# Patient Record
Sex: Male | Born: 1975 | Race: White | Hispanic: No | Marital: Married | Smoking: Former smoker
Health system: Southern US, Community
[De-identification: ages and names within clinical notes are randomized; demographics above are authoritative.]

## PROBLEM LIST (undated history)

## (undated) DIAGNOSIS — T7840XA Allergy, unspecified, initial encounter: Secondary | ICD-10-CM

## (undated) DIAGNOSIS — T8859XA Other complications of anesthesia, initial encounter: Secondary | ICD-10-CM

## (undated) HISTORY — PX: SPINE SURGERY: SHX786

## (undated) HISTORY — DX: Allergy, unspecified, initial encounter: T78.40XA

## (undated) HISTORY — PX: WISDOM TOOTH EXTRACTION: SHX21

---

## 1997-12-06 ENCOUNTER — Emergency Department (HOSPITAL_COMMUNITY): Admission: EM | Admit: 1997-12-06 | Discharge: 1997-12-06 | Payer: Self-pay | Admitting: Emergency Medicine

## 1998-12-14 ENCOUNTER — Emergency Department (HOSPITAL_COMMUNITY): Admission: EM | Admit: 1998-12-14 | Discharge: 1998-12-14 | Payer: Self-pay | Admitting: Emergency Medicine

## 1998-12-14 ENCOUNTER — Encounter: Payer: Self-pay | Admitting: Emergency Medicine

## 2001-12-16 ENCOUNTER — Emergency Department (HOSPITAL_COMMUNITY): Admission: EM | Admit: 2001-12-16 | Discharge: 2001-12-17 | Payer: Self-pay | Admitting: Emergency Medicine

## 2008-07-25 ENCOUNTER — Emergency Department (HOSPITAL_COMMUNITY): Admission: EM | Admit: 2008-07-25 | Discharge: 2008-07-25 | Payer: Self-pay | Admitting: Emergency Medicine

## 2012-09-29 ENCOUNTER — Ambulatory Visit (INDEPENDENT_AMBULATORY_CARE_PROVIDER_SITE_OTHER): Payer: BC Managed Care – PPO | Admitting: Internal Medicine

## 2012-09-29 ENCOUNTER — Ambulatory Visit: Payer: BC Managed Care – PPO

## 2012-09-29 VITALS — BP 126/80 | HR 69 | Temp 97.8°F | Resp 18 | Ht 75.0 in | Wt 218.4 lb

## 2012-09-29 DIAGNOSIS — M25571 Pain in right ankle and joints of right foot: Secondary | ICD-10-CM

## 2012-09-29 DIAGNOSIS — S92401A Displaced unspecified fracture of right great toe, initial encounter for closed fracture: Secondary | ICD-10-CM

## 2012-09-29 DIAGNOSIS — S92919A Unspecified fracture of unspecified toe(s), initial encounter for closed fracture: Secondary | ICD-10-CM

## 2012-09-29 DIAGNOSIS — M25579 Pain in unspecified ankle and joints of unspecified foot: Secondary | ICD-10-CM

## 2012-09-29 MED ORDER — HYDROCODONE-ACETAMINOPHEN 5-325 MG PO TABS: 1.0000 | ORAL_TABLET | Freq: Four times a day (QID) | ORAL | Status: DC | PRN

## 2012-09-29 NOTE — Progress Notes (Signed)
  Subjective:    Patient ID: Frank Collier, male    DOB: 1975/10/12, 37 y.o.   MRN: [id number]  HPI 37 year old male presents with right great toe pain after an injury today.  He was cleaning out his trash bin today and the lid dropped and hit his right great toe.  Injury occurred today approximately 2 hours ago and since has been increasing in pain rapidly.  Also admits to the development of ecchymosis at the proximal nail fold and so he promptly came in for evaluation.  Has limited ROM secondary to pain.  No paresthesias or weakness.       Review of Systems  Gastrointestinal: Negative for nausea and vomiting.  Musculoskeletal: Positive for joint swelling.  Skin: Positive for color change. Negative for wound.  Neurological: Negative for dizziness.       Objective:   Physical Exam  Constitutional: He is oriented to person, place, and time. He appears well-developed and well-nourished.  HENT:  Head: Normocephalic and atraumatic.  Right Ear: External ear normal.  Left Ear: External ear normal.  Eyes: Conjunctivae are normal.  Neck: Normal range of motion.  Cardiovascular: Normal rate.   Pulmonary/Chest: Effort normal.  Musculoskeletal:       Left ankle: Normal.  Right great toe has pain to palpation at interphalangeal joint with ecchymosis at proximal nail fold.  Capillary refill <2 seconds. Sensation intact.    Neurological: He is alert and oriented to person, place, and time.  Psychiatric: He has a normal mood and affect. His behavior is normal. Judgment and thought content normal.         [facility] reading (PRIMARY) by  Dr. Merla Riches as nondisplaced fracture of distal phalanx at medial border of joint line.   Assessment & Plan:  Fracture of great toe, right, closed, initial encounter  Pain in joint, ankle and foot, right - Plan: DG Foot Complete Right, HYDROcodone-acetaminophen (NORCO) 5-325 MG per tablet  Patient Instructions  Recommend ice for 20 minutes three times daily  over the next 72 hours Elevate foot as much as possible Norco 5/325 mg q6hours prn pain Wear post-op shoe while ambulating. Ok to take it off at [employer] of work until follow up.   Follow up on 10/03/12 for recheck   I have reviewed and agree with documentation. Robert P. Merla Riches, M.D.

## 2012-09-29 NOTE — Patient Instructions (Addendum)
Recommend ice for 20 minutes three times daily over the next 72 hours Elevate foot as much as possible Wear post-op shoe while ambulating. Ok to take it off at night Follow up on 10/03/12 for recheck

## 2012-10-03 ENCOUNTER — Ambulatory Visit (INDEPENDENT_AMBULATORY_CARE_PROVIDER_SITE_OTHER): Payer: BC Managed Care – PPO | Admitting: Family Medicine

## 2012-10-03 ENCOUNTER — Ambulatory Visit: Payer: BC Managed Care – PPO

## 2012-10-03 VITALS — BP 129/78 | HR 86 | Temp 98.0°F | Resp 16 | Ht 75.0 in | Wt 218.0 lb

## 2012-10-03 DIAGNOSIS — S99921D Unspecified injury of right foot, subsequent encounter: Secondary | ICD-10-CM

## 2012-10-03 DIAGNOSIS — Z5189 Encounter for other specified aftercare: Secondary | ICD-10-CM

## 2012-10-03 DIAGNOSIS — S92401D Displaced unspecified fracture of right great toe, subsequent encounter for fracture with routine healing: Secondary | ICD-10-CM

## 2012-10-03 NOTE — Progress Notes (Signed)
 [facility]:  Office Visit  Chief Complaint:  Chief Complaint  Patient presents with  . Follow-up    right big toe broken    HPI: Frank Collier is a 37 y.o. male who complains of  Here for recheck of right great toe. Had a lot of bruising and then decreased, with secrease swelling. Still has not put full weight  on it but feels better. Drives a Multimedia programmer. Foot xray was negative for fx/dislocation 4 days ago.   History reviewed. No pertinent past medical history. History reviewed. No pertinent past surgical history. History   Social History  . Marital Status: Married    Spouse Name: N/A    Number of Children: N/A  . Years of Education: N/A   Social History Main Topics  . Smoking status: Never Smoker   . Smokeless tobacco: None  . Alcohol Use: Yes  . Drug Use: No  . Sexually Active: None   Other Topics Concern  . None   Social History Narrative  . None   Family History  Problem Relation Age of Onset  . Heart disease Maternal Grandfather    No Known Allergies Prior to Admission medications   Medication Sig Start Date End Date Taking? Authorizing Provider  HYDROcodone-acetaminophen (NORCO) 5-325 MG per tablet Take 1 tablet by mouth every 6 (six) hours as needed for pain. 09/29/12   Heather Jaquita Rector, PA-C     ROS: The patient denies fevers, chills, night sweats, unintentional weight loss, chest pain, palpitations, wheezing, dyspnea on exertion, nausea, vomiting, abdominal pain, dysuria, hematuria, melena, numbness, weakness, or tingling.   All other systems have been reviewed and were otherwise negative with the exception of those mentioned in the HPI and as above.    PHYSICAL EXAM: Filed Vitals:   10/03/12 0850  BP: 129/78  Pulse: 86  Temp: 98 F (36.7 C)  Resp: 16   Filed Vitals:   10/03/12 0850  Height: 6\' 3"  (1.905 m)  Weight: 218 lb (98.884 kg)   Body mass index is 27.25 kg/(m^2).  General: Alert, no acute distress HEENT:   Normocephalic, atraumatic, oropharynx patent.  Cardiovascular:  Regular rate and rhythm, no rubs murmurs or gallops.  No Carotid bruits, radial pulse intact. No pedal edema.  Respiratory: Clear to auscultation bilaterally.  No wheezes, rales, or rhonchi.  No cyanosis, no use of accessory musculature GI: No organomegaly, abdomen is soft and non-tender, positive bowel sounds.  No masses. Skin: No rashes. Neurologic: Facial musculature symmetric. Psychiatric: Patient is appropriate throughout our interaction. Lymphatic: No cervical lymphadenopathy Musculoskeletal: Gait intact. Right great toe + right toe bruising, + tender on medial Distal portion + DP + swelling Sensation intact, ROm limited but improved due to pain    LABS: No results found for this or any previous visit.   EKG/XRAY:   Primary read interpreted by Dr. Conley Rolls at [facility]. Nondisplaced Fracture of medial distal phalanx of great toe   ASSESSMENT/PLAN: Encounter Diagnoses  Name Primary?  . Injury of great toe, right, subsequent encounter Yes  . Fracture of great toe, right, closed, with routine healing, subsequent encounter    Got a closer look at great toe with digit xray There is a nondisplaced fracture Covered under global period He will return to work with precautions on Monday I think it should be ok, if he has pain and cannot use his foot then he will let his supervisor know Malunion precautions and activity modifications given Buddy tape great toe  and use steel toe boot, at home use post op shoe.  C/w Hydrocodone and NSAIDs prn F/u prn otherwise in 2 weeks for f/u     ,  PHUONG, DO 10/05/2012 1:59 PM

## 2012-10-06 ENCOUNTER — Telehealth: Payer: Self-pay

## 2012-10-06 NOTE — Telephone Encounter (Signed)
This is reasonable, I have called to advise.

## 2012-10-06 NOTE — Telephone Encounter (Signed)
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

## 2012-10-20 ENCOUNTER — Ambulatory Visit: Payer: BC Managed Care – PPO

## 2012-10-20 ENCOUNTER — Ambulatory Visit (INDEPENDENT_AMBULATORY_CARE_PROVIDER_SITE_OTHER): Payer: BC Managed Care – PPO | Admitting: Family Medicine

## 2012-10-20 VITALS — BP 118/76 | HR 88 | Temp 97.7°F | Resp 16 | Ht 75.0 in | Wt 224.0 lb

## 2012-10-20 DIAGNOSIS — S8290XS Unspecified fracture of unspecified lower leg, sequela: Secondary | ICD-10-CM

## 2012-10-20 DIAGNOSIS — M79609 Pain in unspecified limb: Secondary | ICD-10-CM

## 2012-10-20 DIAGNOSIS — S92401S Displaced unspecified fracture of right great toe, sequela: Secondary | ICD-10-CM

## 2012-10-20 DIAGNOSIS — M25571 Pain in right ankle and joints of right foot: Secondary | ICD-10-CM

## 2012-10-20 MED ORDER — HYDROCODONE-ACETAMINOPHEN 5-325 MG PO TABS: 1.0000 | ORAL_TABLET | Freq: Four times a day (QID) | ORAL | Status: DC | PRN

## 2012-10-20 NOTE — Progress Notes (Signed)
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 States that the pain is much less but that he still does has a significant amount of discomfort. Yesterday we probably would the toe at the end of the boot and he had pain all day. He's noticing pain in his back and other parts of his body as he adjusts to it the weight off the great toe.  Objective: No acute distress Right great toe shows mild swelling and a small subungual hematoma at the lunula. [facility] reading (PRIMARY) by  Dr. Milus Glazier:  Right great toe. Still some gap in the diagonal lateral proximal piece of bone in the distal phalanx of the great toe.  Assessment: Without callus, patient still has a stable fracture and needs more time.  Plan: Return in 10 days for followup. unable to work until then  [employer], [employer].D.

## 2012-10-30 ENCOUNTER — Ambulatory Visit (INDEPENDENT_AMBULATORY_CARE_PROVIDER_SITE_OTHER): Payer: BC Managed Care – PPO | Admitting: Emergency Medicine

## 2012-10-30 VITALS — BP 120/78 | HR 87 | Temp 97.7°F | Resp 16 | Ht 76.5 in | Wt 219.0 lb

## 2012-10-30 DIAGNOSIS — S8290XS Unspecified fracture of unspecified lower leg, sequela: Secondary | ICD-10-CM

## 2012-10-30 DIAGNOSIS — S92401S Displaced unspecified fracture of right great toe, sequela: Secondary | ICD-10-CM

## 2012-10-30 MED ORDER — NAPROXEN SODIUM 550 MG PO TABS: 550.0000 mg | ORAL_TABLET | Freq: Two times a day (BID) | ORAL | Status: AC

## 2012-10-30 NOTE — Progress Notes (Signed)
[facility] [address] [place] [address] [phone]  Date:  10/30/2012   Name:  Willian Collier   DOB:  [birth year]   MRN:  [id number]  PCP:  Nilda Simmer, MD    Chief Complaint: Follow-up   History of Present Illness:  Frank Collier is a 37 y.o. very pleasant male patient who presents with the following:  Four weeks out from fracture RIGHT great toe.  Has persistent pain.  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 Yesterday put on his work boot and had pain within 10 minutes.  Not taking any medication currently.  Denies other complaint or health concern today.   There are no active problems to display for this patient.   Past Medical History  Diagnosis Date  . Allergy     seasonal    Past Surgical History  Procedure Laterality Date  . Spine surgery      epidural  . Wisdom tooth extraction      History  Substance Use Topics  . Smoking status: Never Smoker   . Smokeless tobacco: Not on file  . Alcohol Use: Yes    Family History  Problem Relation Age of Onset  . Heart disease Maternal Grandfather     No Known Allergies  Medication list has been reviewed and updated.  Current Outpatient Prescriptions on File Prior to Visit  Medication Sig Dispense Refill  . HYDROcodone-acetaminophen (NORCO) 5-325 MG per tablet Take 1 tablet by mouth every 6 (six) hours as needed for pain.  15 tablet  0   No current facility-administered medications on file prior to visit.    Review of Systems:  As per HPI, otherwise negative.    Physical Examination: Filed Vitals:   10/30/12 0814  BP: 120/78  Pulse: 87  Temp: 97.7 F (36.5 C)  Resp: 16   Filed Vitals:   10/30/12 0814  Height: 6' 4.5" (1.943 m)  Weight: 219 lb (99.338 kg)   Body mass index is 26.31 kg/(m^2). Ideal Body Weight: Weight in (lb) to have BMI = 25: 207.7   GEN: WDWN, NAD, Non-toxic, Alert & Oriented x 3 HEENT: Atraumatic, Normocephalic.  Ears and Nose: No  external deformity. EXTR: No clubbing/cyanosis/edema NEURO: Normal gait.  PSYCH: Normally interactive. Conversant. Not depressed or anxious appearing.  Calm demeanor.  RIGHT foot:  Tender great toe.  Guards.  No deformity or ecchymosis  Assessment and Plan: Fracture right great toe. Anaprox Follow up in 2 weeks   Signed,  Phillips Odor, MD

## 2012-10-30 NOTE — Patient Instructions (Addendum)
Toe Fracture  Your caregiver has diagnosed you as having a fractured toe. A toe fracture is a break in the bone of a toe. "Buddy taping" is a way of splinting your broken toe, by taping the broken toe to the toe next to it. This "buddy taping" will keep the injured toe from moving beyond normal range of motion. Buddy taping also helps the toe heal in a more normal alignment. It may take 6 to 8 weeks for the toe injury to heal.  HOME CARE INSTRUCTIONS    Leave your toes taped together for as long as directed by your caregiver or until you see a doctor for a follow-up examination. You can change the tape after bathing. Always use a small piece of gauze or cotton between the toes when taping them together. This will help the skin stay dry and prevent infection.   Apply ice to the injury for 15-20 minutes each hour while awake for the first 2 days. Put the ice in a plastic bag and place a towel between the bag of ice and your skin.   After the first 2 days, apply heat to the injured area. Use heat for the next 2 to 3 days. Place a heating pad on the foot or soak the foot in warm water as directed by your caregiver.   Keep your foot elevated as much as possible to lessen swelling.   Wear sturdy, supportive shoes. The shoes should not pinch the toes or fit tightly against the toes.   Your caregiver may prescribe a rigid shoe if your foot is very swollen.   Your may be given crutches if the pain is too great and it hurts too much to walk.   Only take over-the-counter or prescription medicines for pain, discomfort, or fever as directed by your caregiver.   If your caregiver has given you a follow-up appointment, it is very important to keep that appointment. Not keeping the appointment could result in a chronic or permanent injury, pain, and disability. If there is any problem keeping the appointment, you must call back to this facility for assistance.  SEEK MEDICAL CARE IF:    You have increased pain or swelling,  not relieved with medications.   The pain does not get better after 1 week.   Your injured toe is cold when the others are warm.  SEEK IMMEDIATE MEDICAL CARE IF:    The toe becomes cold, numb, or white.   The toe becomes hot (inflamed) and red.  Document Released: 03/30/2000 Document Revised: 06/25/2011 Document Reviewed: 11/17/2007  ExitCare Patient Information 2014 [employer].

## 2012-11-13 ENCOUNTER — Ambulatory Visit (INDEPENDENT_AMBULATORY_CARE_PROVIDER_SITE_OTHER): Payer: BC Managed Care – PPO | Admitting: Emergency Medicine

## 2012-11-13 ENCOUNTER — Ambulatory Visit: Payer: BC Managed Care – PPO

## 2012-11-13 VITALS — BP 120/78 | HR 83 | Temp 97.9°F | Resp 16 | Ht 75.0 in | Wt 221.0 lb

## 2012-11-13 DIAGNOSIS — S92911D Unspecified fracture of right toe(s), subsequent encounter for fracture with routine healing: Secondary | ICD-10-CM

## 2012-11-13 DIAGNOSIS — S8290XD Unspecified fracture of unspecified lower leg, subsequent encounter for closed fracture with routine healing: Secondary | ICD-10-CM

## 2012-11-13 NOTE — Progress Notes (Signed)
  Subjective:    Patient ID: Frank Collier, male    DOB: Jul 17, 1975, 37 y.o.   MRN: [id number]  HPI patient here to follow up right great toe fracture. He is doing well. He does have persistent pain. 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    Review of Systems     Objective:   Physical Exam There is no significant swelling of the great toe. He has limited flexion extension at the IP and MTP joint  [facility] reading (PRIMARY) by  Dr. Cleta Alberts healing fracture       Assessment & Plan:  The patient is doing well. He is to use her stretching exercises for his great toe. He was released to return to work on 11/22/2012 no restrictions. 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

## 2012-11-25 ENCOUNTER — Encounter: Payer: Self-pay | Admitting: Family Medicine

## 2013-03-04 ENCOUNTER — Ambulatory Visit (INDEPENDENT_AMBULATORY_CARE_PROVIDER_SITE_OTHER): Payer: BC Managed Care – PPO | Admitting: Family Medicine

## 2013-03-04 ENCOUNTER — Encounter: Payer: Self-pay | Admitting: Family Medicine

## 2013-03-04 DIAGNOSIS — Z9889 Other specified postprocedural states: Secondary | ICD-10-CM | POA: Insufficient documentation

## 2013-03-04 DIAGNOSIS — T6591XS Toxic effect of unspecified substance, accidental (unintentional), sequela: Secondary | ICD-10-CM

## 2013-03-04 DIAGNOSIS — J309 Allergic rhinitis, unspecified: Secondary | ICD-10-CM | POA: Insufficient documentation

## 2013-03-04 LAB — COMPREHENSIVE METABOLIC PANEL
Albumin: 4.5 g/dL (ref 3.5–5.2)
Alkaline Phosphatase: 63 U/L (ref 39–117)
BUN: 12 mg/dL (ref 6–23)
CO2: 24 mEq/L (ref 19–32)
Calcium: 9.6 mg/dL (ref 8.4–10.5)
Chloride: 102 mEq/L (ref 96–112)
Glucose, Bld: 139 mg/dL — ABNORMAL HIGH (ref 70–99)
Potassium: 3.9 mEq/L (ref 3.5–5.3)

## 2013-03-04 LAB — CBC WITH DIFFERENTIAL/PLATELET
Basophils Relative: 0 % (ref 0–1)
HCT: 40.6 % (ref 39.0–52.0)
Hemoglobin: 14 g/dL (ref 13.0–17.0)
Lymphs Abs: 1.9 10*3/uL (ref 0.7–4.0)
MCHC: 34.5 g/dL (ref 30.0–36.0)
Monocytes Absolute: 0.5 10*3/uL (ref 0.1–1.0)
Monocytes Relative: 8 % (ref 3–12)
Neutro Abs: 3 10*3/uL (ref 1.7–7.7)

## 2013-03-04 NOTE — Patient Instructions (Signed)
You received appropriate and adequate care 2 months ago after the stingray injury. The tenderness and nodule may slowly resolve over time. You will be notified about lab results within next 5 days.

## 2013-03-04 NOTE — Progress Notes (Signed)
S:  This 37 y. o. Cauc male is in good health; he is here today for recheck of area on L hand where he was stung by stingray 2 months ago. He picked up a small stingray and was stung; he received medical attention at a medical facility on [facility] on the [address], [place]. The physician prescribed Cipro and pt received Tetanus. He still has some tenderness and notes a small nodule at sting site. His wife is concerned about cardiac sueqelae based on information she has read online. Pt denies fever/chills, increased redness or streaks up arm, fatigue, myalgias or persistent pain or drainage from wound site. He has not experienced CP or tightness, palpitations, SOB or cough or any unusual cardiac or respiratory symptoms.  PMHx, Surg Hx, Soc and Fam Hx reviewed.  ROS: As per HPI.  O: Filed Vitals:   03/04/13 0957  BP: 122/68  Pulse: 79  Temp: 98 F (36.7 C)  Resp: 16   GEN: In NAD: WN,WD. HENT: [place]/AT; EOMI w/ clear conj/sclerae; otherwise unremarkable. COR: RRR. LUNGS: Unlabored resp. SKIN: W&D; intact. L wrist proximal to thenar eminence- 2-3 mm erythematous area with mobile subcutaneous nodule palpable w/ deep palpation. No discharge or fluctuance. Red streaks or joint swelling. MS: Normal ROM in wrist joints. Neurovascular intact. NEURO: A&O x 3; Cns intact. Grip normal in both hands. Nonfocal.  A/P: Marine animal sting, sequela - Plan: CBC with Differential, Comprehensive metabolic panel Reassurance that he received adequate and appropriate treatment at the time of his injury.

## 2013-03-06 ENCOUNTER — Encounter: Payer: Self-pay | Admitting: Family Medicine

## 2013-11-08 IMAGING — CR DG FOOT COMPLETE 3+V*R*
3 series · 3 of 3 positions shown · non-contrast
Comparison: None.

CLINICAL DATA: Dropped trash can lid foot, now with foot pain

RIGHT FOOT COMPLETE - 3+ VIEW

[AP]
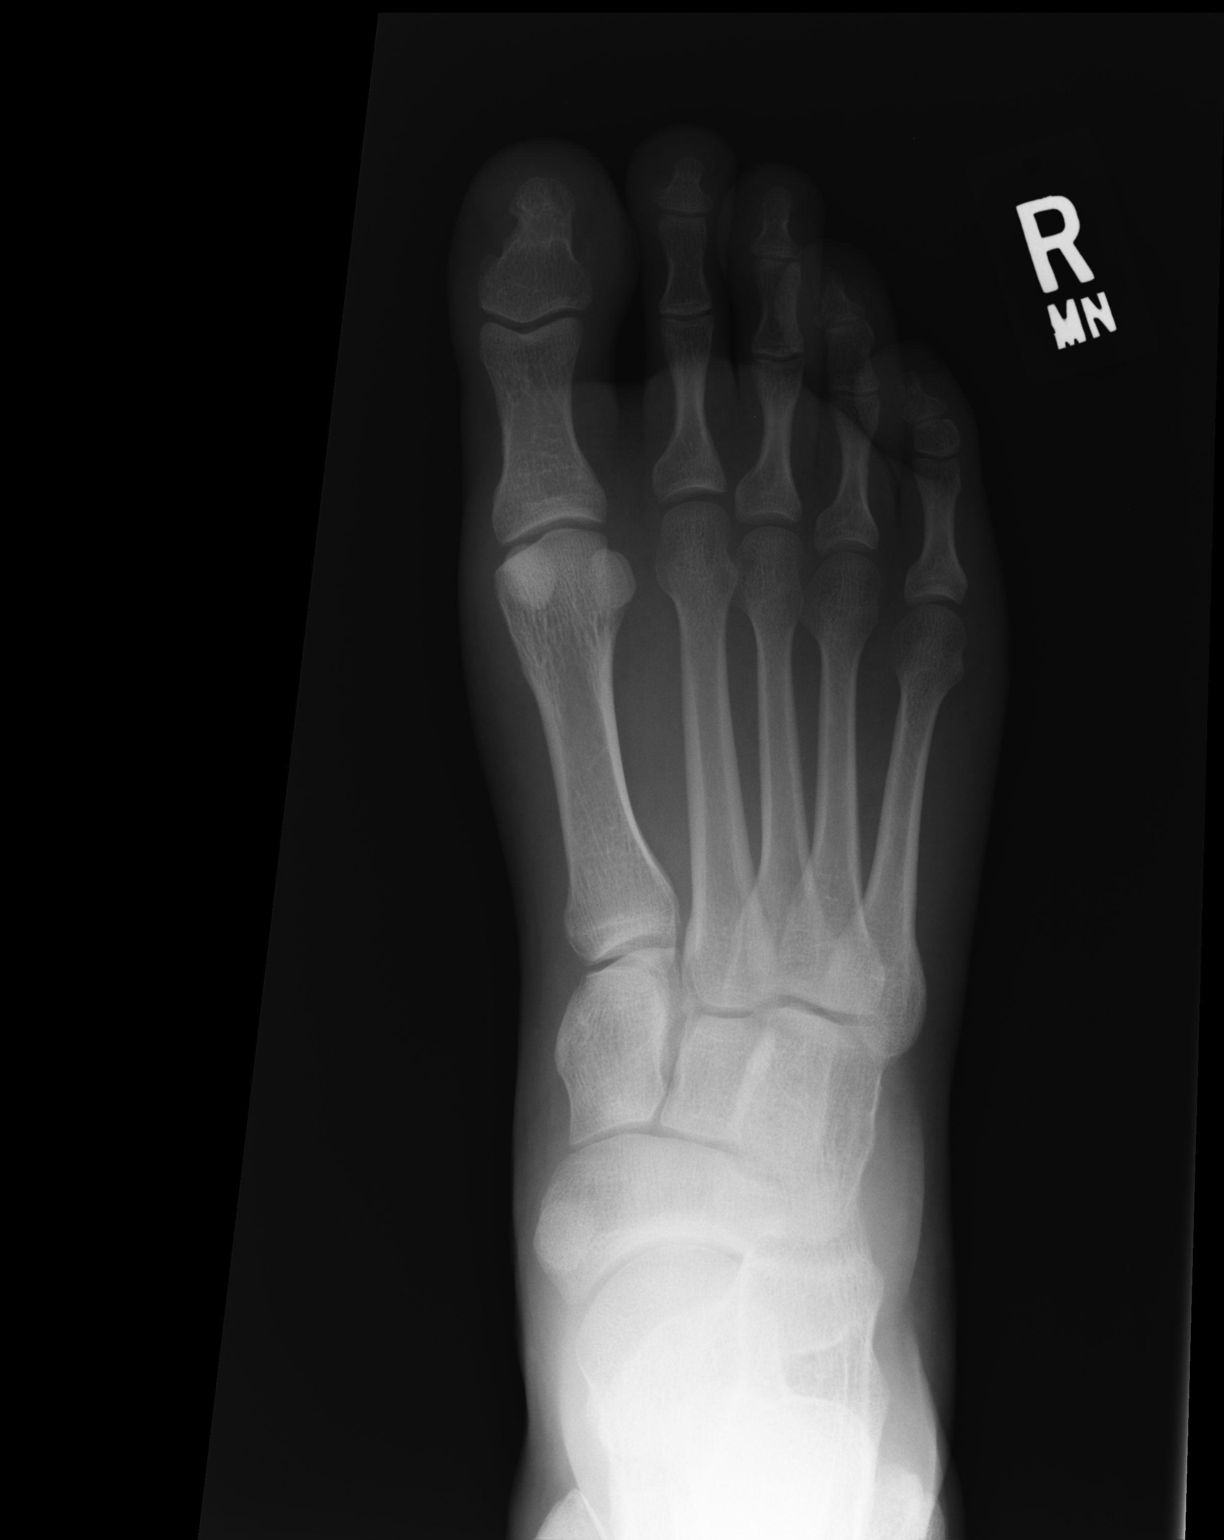

[ap obl int rot]
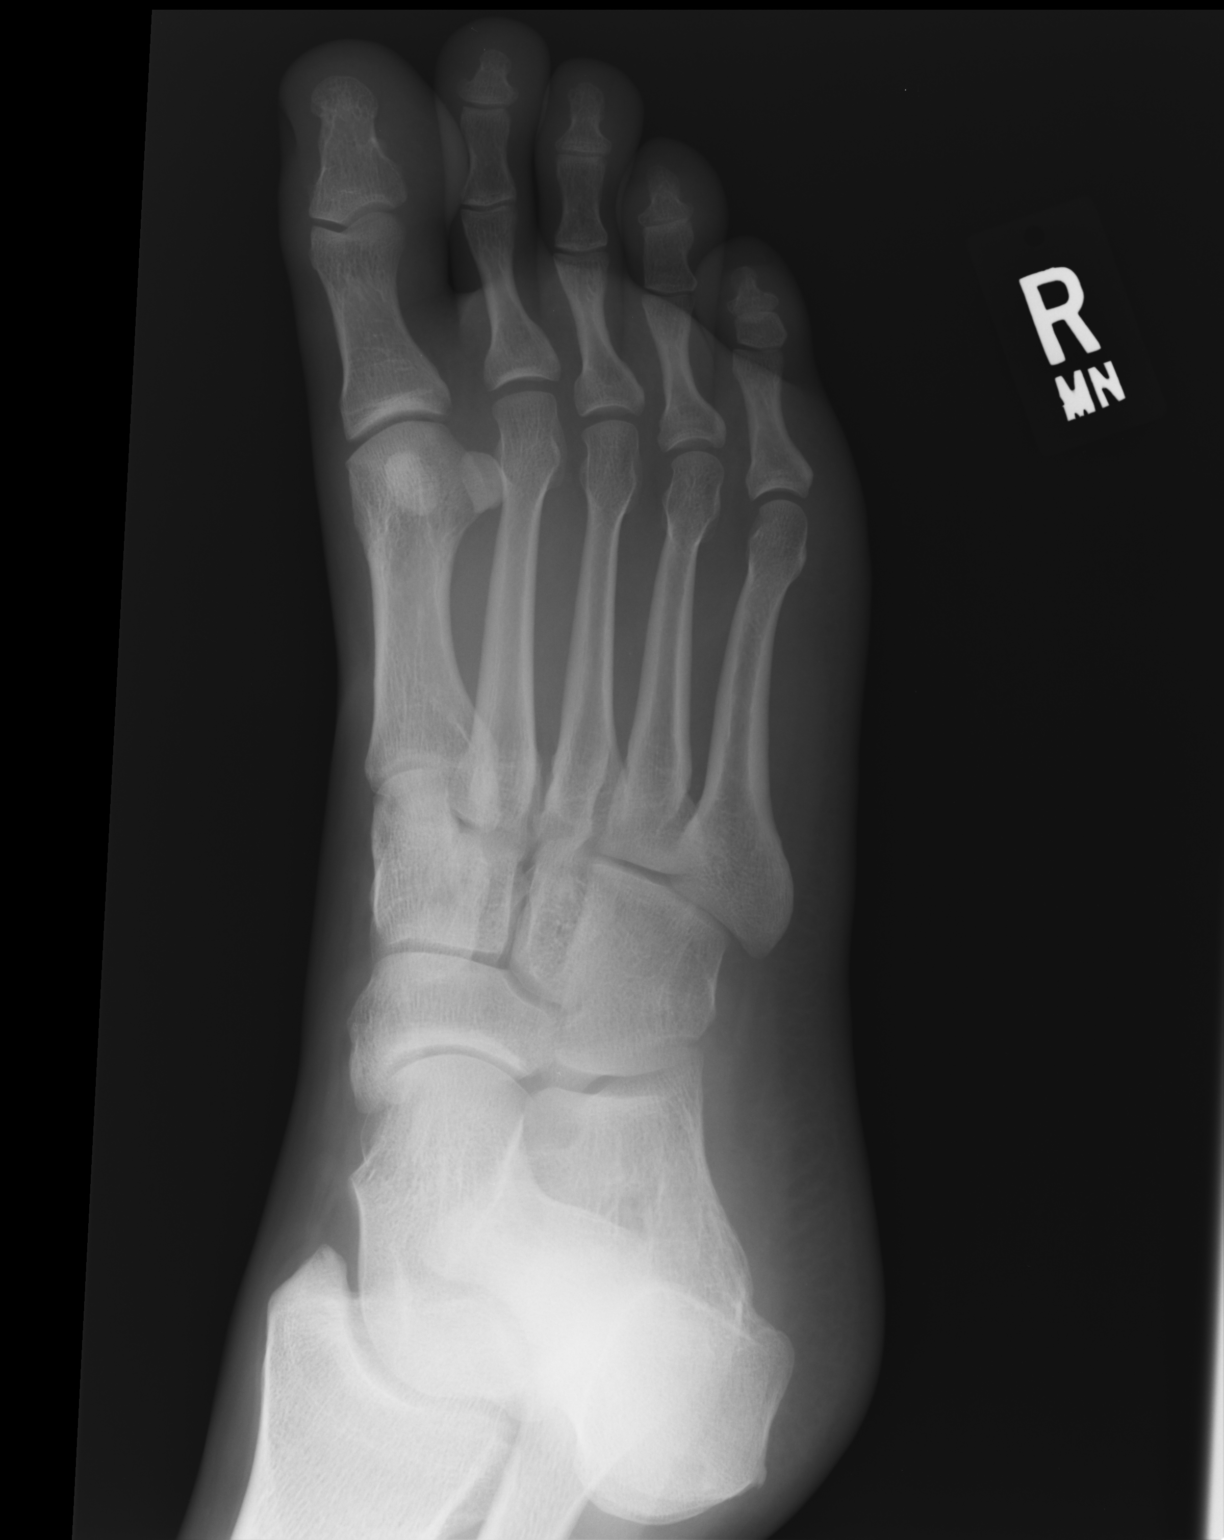

[lateral]
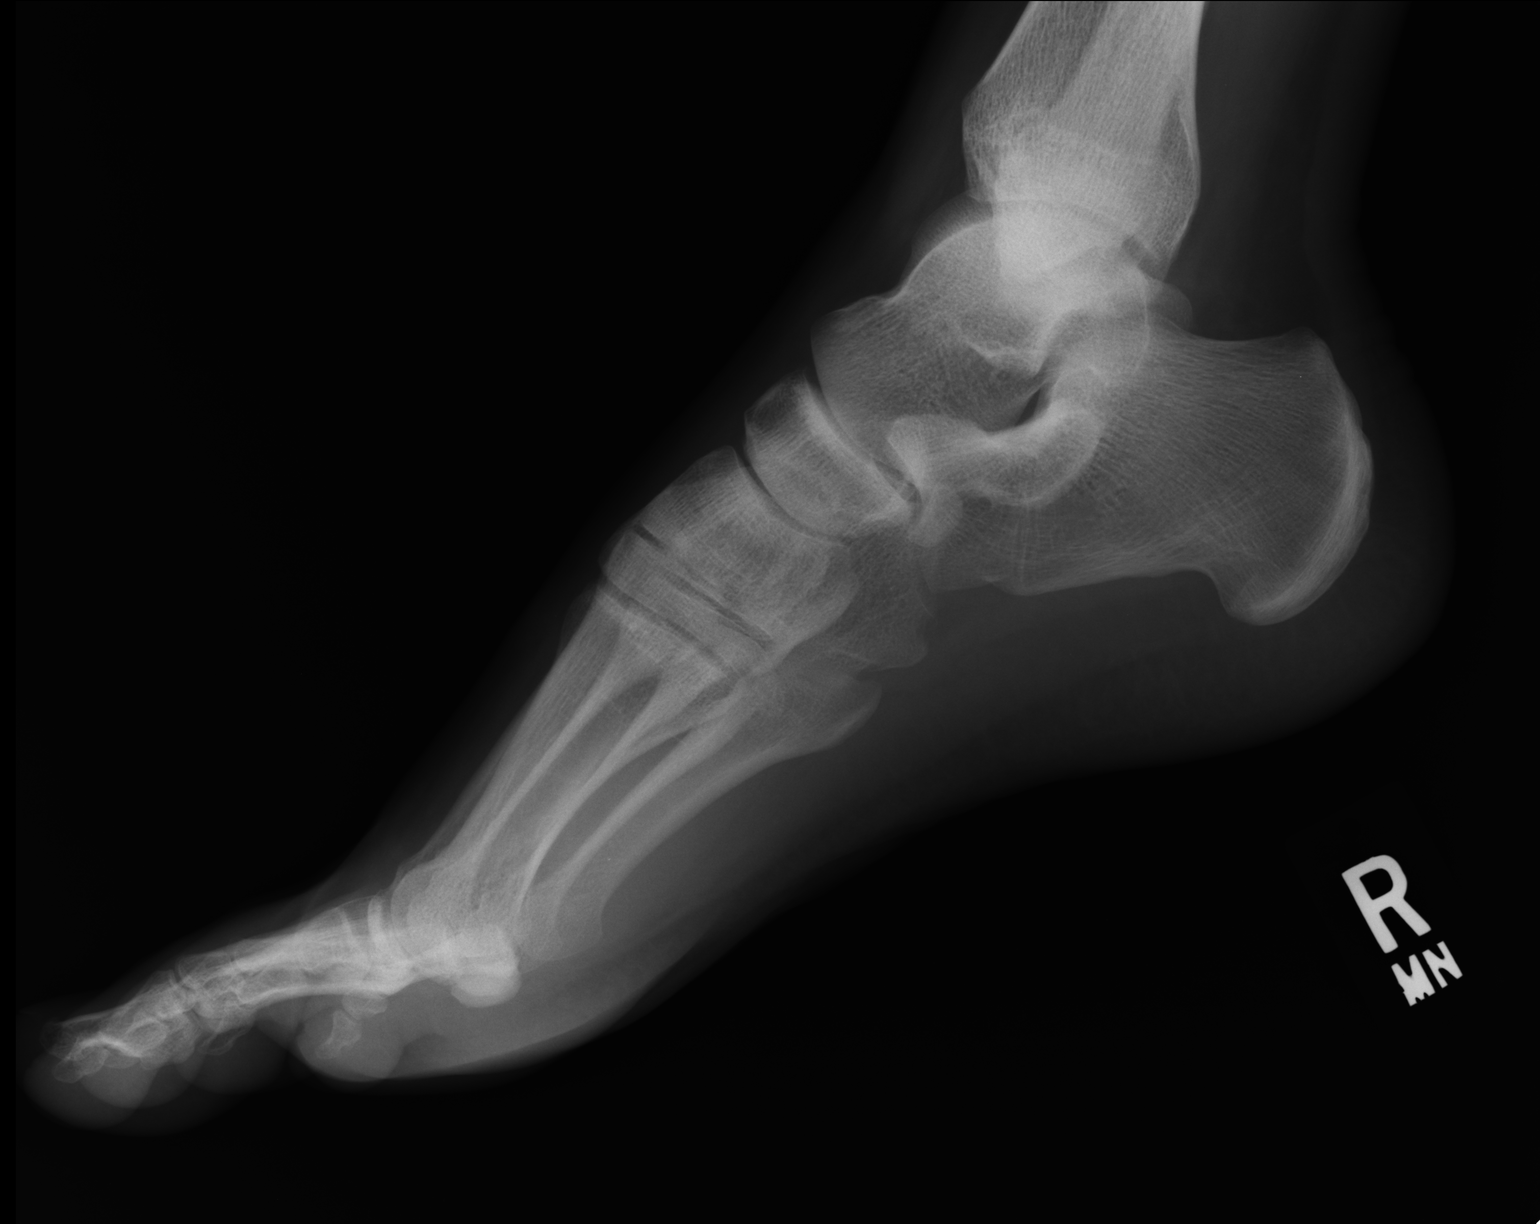

[3 of 3 positions shown; findings below may reference images not displayed]

FINDINGS: No fracture or dislocation.  Joint spaces are preserved.
No erosions.  Regional soft tissues are normal.  No radiopaque
foreign body.
IMPRESSION: No fracture or radiopaque foreign body.  Dedicated radiographs of
the digit of interest may be performed as clinically indicated.

Clinically significant discrepancy from primary report, if
provided: None

## 2013-12-23 IMAGING — CR DG TOE GREAT 2+V*R*
2 series · 2 of 2 positions shown · non-contrast
Comparison: 10/20/2012

CLINICAL DATA: Recheck fracture.

RIGHT GREAT TOE

[AP]
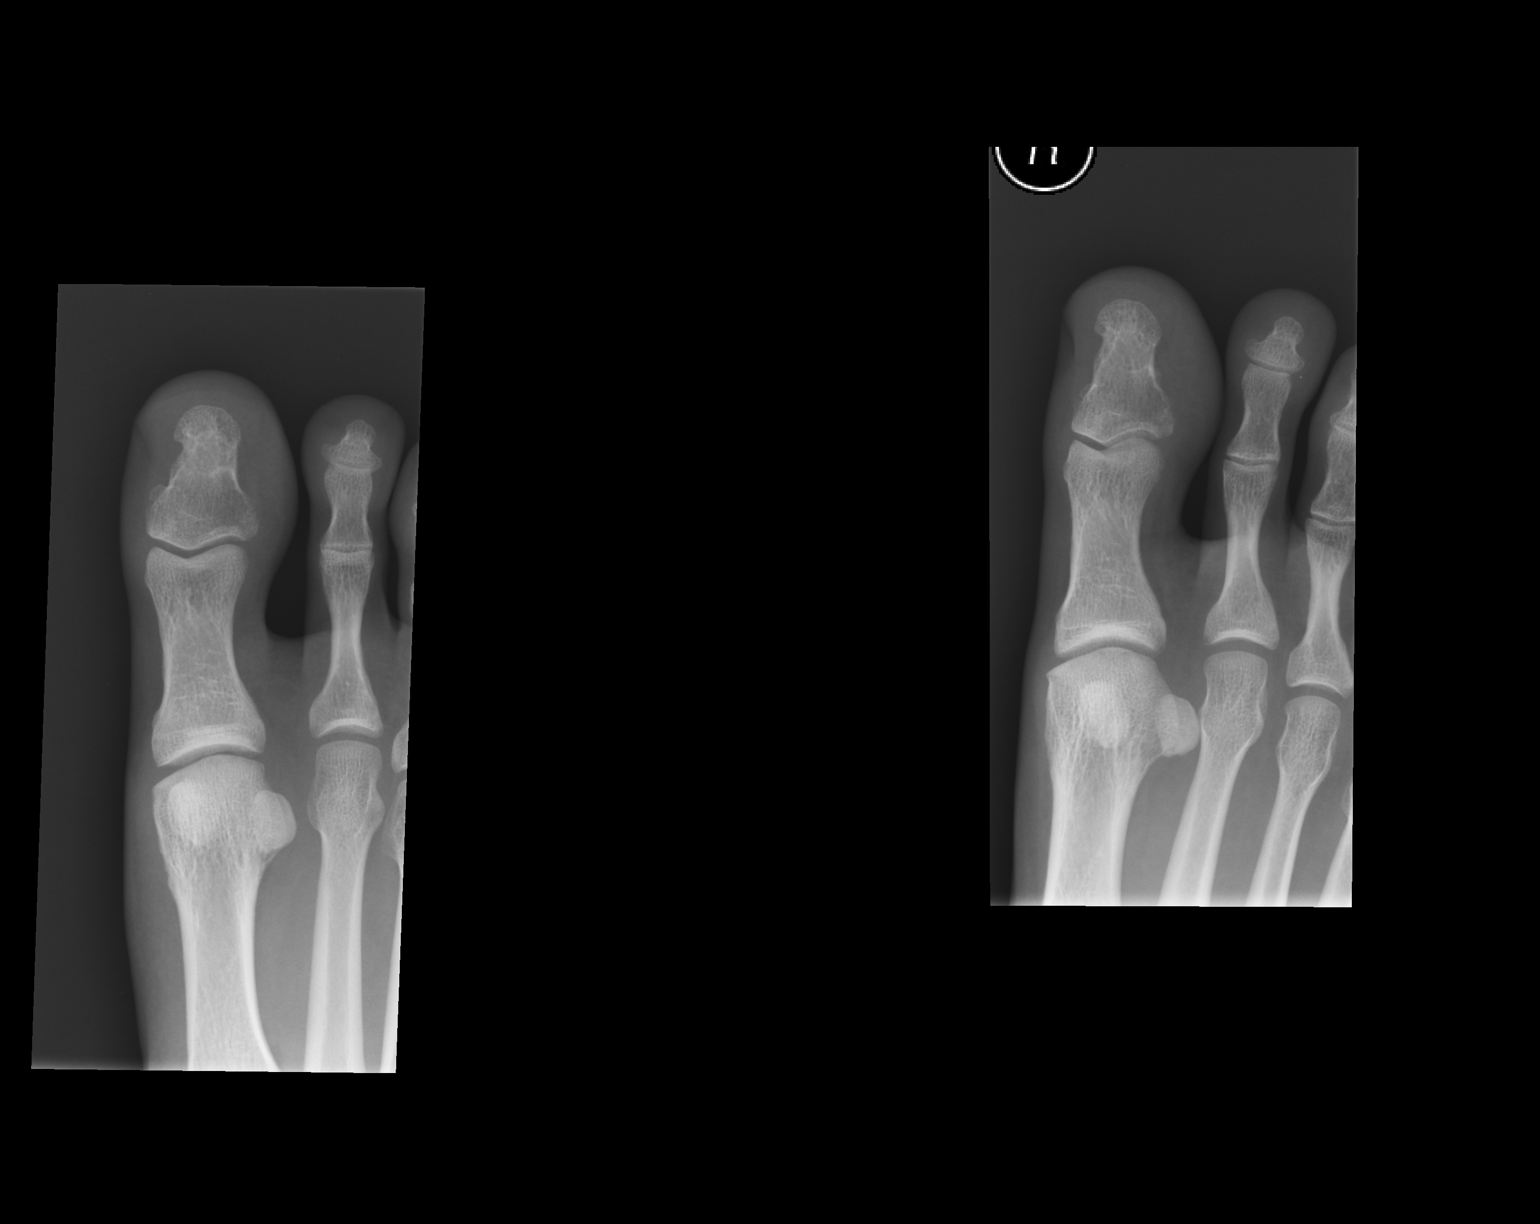

[lateral]
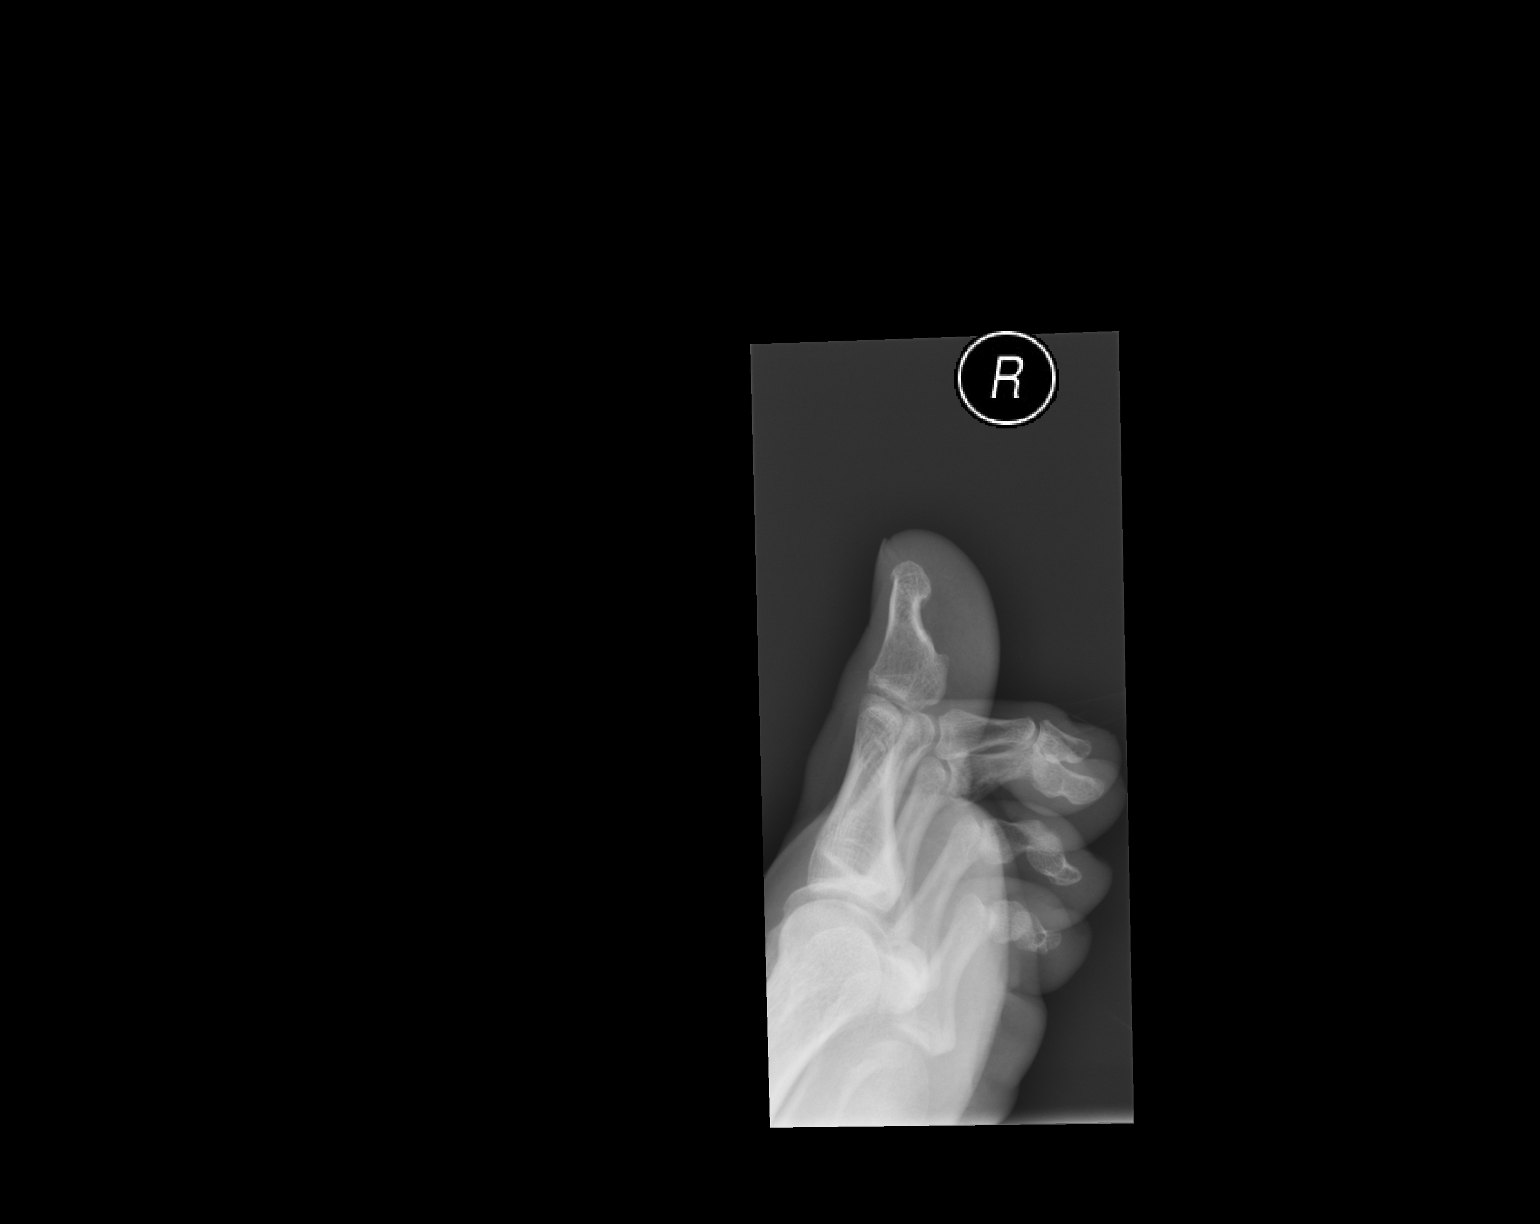

[2 of 2 positions shown; findings below may reference images not displayed]

FINDINGS: Evidence of healing fracture at the base of the right
great toe distal phalanx.  Fracture line difficult to visualize
currently.  No additional acute bony abnormality.
IMPRESSION: Healing fracture at the base of the right great toe distal phalanx.

Clinically significant discrepancy from primary report, if
provided: None

## 2013-12-24 ENCOUNTER — Ambulatory Visit (INDEPENDENT_AMBULATORY_CARE_PROVIDER_SITE_OTHER): Payer: BC Managed Care – PPO | Admitting: Emergency Medicine

## 2013-12-24 VITALS — BP 124/72 | HR 73 | Temp 97.8°F | Resp 18 | Ht 76.0 in | Wt 220.2 lb

## 2013-12-24 DIAGNOSIS — M25571 Pain in right ankle and joints of right foot: Secondary | ICD-10-CM

## 2013-12-24 DIAGNOSIS — S8290XS Unspecified fracture of unspecified lower leg, sequela: Secondary | ICD-10-CM

## 2013-12-24 DIAGNOSIS — M25579 Pain in unspecified ankle and joints of unspecified foot: Secondary | ICD-10-CM

## 2013-12-24 DIAGNOSIS — S92401S Displaced unspecified fracture of right great toe, sequela: Secondary | ICD-10-CM

## 2013-12-24 DIAGNOSIS — S335XXA Sprain of ligaments of lumbar spine, initial encounter: Secondary | ICD-10-CM

## 2013-12-24 MED ORDER — CYCLOBENZAPRINE HCL 10 MG PO TABS: 10.0000 mg | ORAL_TABLET | Freq: Three times a day (TID) | ORAL | Status: DC | PRN

## 2013-12-24 MED ORDER — NAPROXEN SODIUM 550 MG PO TABS
550.0000 mg | ORAL_TABLET | Freq: Two times a day (BID) | ORAL | Status: AC
Start: 2013-12-24 — End: 2014-12-24

## 2013-12-24 MED ORDER — HYDROCODONE-ACETAMINOPHEN 5-325 MG PO TABS: 1.0000 | ORAL_TABLET | ORAL | Status: DC | PRN

## 2013-12-24 NOTE — Progress Notes (Signed)
[facility] [address], [place] [address] [phone]  Date:  12/24/2013   Name:  Frank Collier   DOB:  11/06/1975   MRN:  [id number]  PCP:  Nilda Simmer, MD    Chief Complaint: Back Pain   History of Present Illness:  Frank Collier is a 38 y.o. very pleasant male patient who presents with the following:   Says he has long term history of injury to back with disc space narrowing and recurrent pain.  Advised to have a fusion and refused. Has a flare of pain that has been present since Sunday.  No history of injury or recent overuse.   Pain is on left side.  Denies radiation of pain, neuro symptoms No improvement with over the counter medications or other home remedies. Denies other complaint or health concern today.   Patient Active Problem List   Diagnosis Date Noted  . Allergic rhinitis 03/04/2013  . Personal history of spine surgery 03/04/2013    Past Medical History  Diagnosis Date  . Allergy     seasonal    Past Surgical History  Procedure Laterality Date  . Spine surgery      epidural  . Wisdom tooth extraction      History  Substance Use Topics  . Smoking status: Former Games developer  . Smokeless tobacco: Not on file  . Alcohol Use: Yes    Family History  Problem Relation Age of Onset  . Heart disease Maternal Grandfather     No Known Allergies  Medication list has been reviewed and updated.  No current outpatient prescriptions on file prior to visit.   No current facility-administered medications on file prior to visit.    Review of Systems: As per HPI, otherwise negative.    Physical Examination: Filed Vitals:   12/24/13 1239  BP: 124/72  Pulse: 73  Temp: 97.8 F (36.6 C)  Resp: 18   Filed Vitals:   12/24/13 1239  Height:  (1.93 m)  Weight: 220 lb 3.2 oz (99.882 kg)   Body mass index is 26.81 kg/(m^2). Ideal Body Weight: Weight in (lb) to have BMI = 25: 205   GEN: WDWN, moderate distress,  Non-toxic, Alert & Oriented x 3 HEENT: Atraumatic, Normocephalic.  Ears and Nose: No external deformity. EXTR: No clubbing/cyanosis/edema NEURO: antalgic gait.  PSYCH: Normally interactive. Conversant. Not depressed or anxious appearing.  Calm demeanor.  BACK:  Left flank tenderness.  Neuro intact  Assessment and Plan: Lumbosacral strain Anaprox Flexeril vicodin Follow up with [facility]   Signed,  Phillips Odor, MD

## 2013-12-24 NOTE — Patient Instructions (Signed)

## 2014-03-25 ENCOUNTER — Telehealth: Payer: Self-pay | Admitting: Family Medicine

## 2014-03-25 NOTE — Telephone Encounter (Signed)
Patient received flu shot at [facility] on 03/18/14

## 2015-06-23 ENCOUNTER — Ambulatory Visit (INDEPENDENT_AMBULATORY_CARE_PROVIDER_SITE_OTHER): Payer: BLUE CROSS/BLUE SHIELD | Admitting: Family Medicine

## 2015-06-23 VITALS — BP 118/70 | HR 73 | Temp 97.5°F | Resp 18 | Ht 76.0 in | Wt 216.4 lb

## 2015-06-23 DIAGNOSIS — K120 Recurrent oral aphthae: Secondary | ICD-10-CM | POA: Diagnosis not present

## 2015-06-23 MED ORDER — LIDOCAINE VISCOUS 2 % MT SOLN: 20.0000 mL | OROMUCOSAL | Status: DC | PRN

## 2015-06-23 MED ORDER — TRIAMCINOLONE ACETONIDE 0.1 % MT PSTE: 1.0000 "application " | PASTE | Freq: Two times a day (BID) | OROMUCOSAL | Status: DC

## 2015-06-23 NOTE — Progress Notes (Signed)
   Subjective:    Patient ID: Frank Collier, male    DOB: 1975-08-31, 40 y.o.   MRN: [id number]  HPI This is a pleasant 40 yo male who presents today with two canker sores in his mouth. He has had these off and on for years, but these are worse today and he is having a great deal of pain and difficulty eating due to the pain. Tried some over the counter numbing medicine with some relief. Triggered by stress. 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   Past Medical History  Diagnosis Date  . Allergy     seasonal   Past Surgical History  Procedure Laterality Date  . Spine surgery      epidural  . Wisdom tooth extraction     Family History  Problem Relation Age of Onset  . Heart disease Maternal Grandfather    Social History  Substance Use Topics  . Smoking status: Former Games developermoker  . Smokeless tobacco: None  . Alcohol Use: Yes      Review of Systems No fever or chills, no difficulty swallowing, just pain, no runny nose, no sore throat, no cough.     Objective:   Physical Exam  Constitutional: He is oriented to person, place, and time. He appears well-nourished.  HENT:  Head: Normocephalic and atraumatic.  Right Ear: External ear normal.  Left Ear: External ear normal.  Mouth/Throat: No oropharyngeal exudate.  Right ventral tongue with approximately 1 cm ulceration, right buccal membrane with 0.8 mm ulceration.   Cardiovascular: Normal rate.   Pulmonary/Chest: Effort normal.  Musculoskeletal: Normal range of motion.  Neurological: He is alert and oriented to person, place, and time.  Skin: Skin is warm and dry.  Psychiatric: He has a normal mood and affect. His behavior is normal. Judgment and thought content normal.  Vitals reviewed.     BP 118/70 mmHg  Pulse 73  Temp(Src) 97.5 F (36.4 C) (Oral)  Resp 18  Ht 6\' 4"  (1.93 m)  Wt 216 lb 6.4 oz (98.158 kg)  BMI 26.35 kg/m2  SpO2 99% Wt Readings from Last 3  Encounters:  06/23/15 216 lb 6.4 oz (98.158 kg)  12/24/13 220 lb 3.2 oz (99.882 kg)  03/04/13 219 lb 9.6 oz (99.61 kg)       Assessment & Plan:  1. Aphthous ulcer of mouth - triamcinolone (KENALOG) 0.1 % paste; Use as directed 1 application in the mouth or throat 2 (two) times daily. Until resolved (maximum 7 day)  Dispense: 5 g; Refill: 12 - lidocaine (XYLOCAINE) 2 % solution; Use as directed 20 mLs in the mouth or throat as needed for mouth pain.  Dispense: 100 mL; Refill: 0 - OTC analgesics prn  Olean Reeeborah Gessner, FNP-BC  [facility]  06/23/2015 10:41 AM

## 2015-06-23 NOTE — Patient Instructions (Signed)
Use ibuprofen 2 - 3 tablets as needed up to every 8-12 hours

## 2015-12-09 ENCOUNTER — Encounter (HOSPITAL_COMMUNITY): Payer: Self-pay | Admitting: Emergency Medicine

## 2015-12-09 ENCOUNTER — Emergency Department (HOSPITAL_COMMUNITY)
Admission: EM | Admit: 2015-12-09 | Discharge: 2015-12-09 | Disposition: A | Discharge status: Home / Self Care | Payer: BLUE CROSS/BLUE SHIELD | Attending: Emergency Medicine | Admitting: Emergency Medicine

## 2015-12-09 DIAGNOSIS — J029 Acute pharyngitis, unspecified: Secondary | ICD-10-CM | POA: Insufficient documentation

## 2015-12-09 DIAGNOSIS — Z87891 Personal history of nicotine dependence: Secondary | ICD-10-CM | POA: Insufficient documentation

## 2015-12-09 DIAGNOSIS — Z79899 Other long term (current) drug therapy: Secondary | ICD-10-CM | POA: Diagnosis not present

## 2015-12-09 LAB — RAPID STREP SCREEN (MED CTR MEBANE ONLY): STREPTOCOCCUS, GROUP A SCREEN (DIRECT): NEGATIVE

## 2015-12-09 NOTE — Discharge Instructions (Signed)
Take Ibuprofen and tylenol as needed for pain. May gargle with salt water to hep with pain. If you start to develop a fever or worsening sore throat return to ED. Follow up with PCP. May call ENT if symptoms worsen.

## 2015-12-09 NOTE — ED Provider Notes (Signed)
[facility]-EMERGENCY DEPT Provider Note   CSN: [id number] Arrival date & time: 12/09/15  0714     History   Chief Complaint Chief Complaint  Patient presents with  . Sore Throat    HPI Frank Collier is a 40 y.o. male.  40 year old Caucasian male with no past medical history presents to the ED this morning with sore throat. Patient states his sore throat started approximately 4 days ago. The pain is constant. Gradually worsening. Nothing makes better or worse. Patient has not tried anything for the pain. Denies any fevers, chills, otalgia, rhinorrhea, cough, congestion, chest pain, shortness of breath, abd pain, urinary symptoms, change in bowel habits. Patient was diagnosed with strep throat in June and July of this year. He was treated with Augmentin 2 separate times. He finished his last course of antibiotics on August 5. Patient started to develop a rash approximately 2 weeks ago. Was seen by dermatologist this past Monday. Diagnosed with Guttate psoriasis. Given steroid cream. Patient states that it has helped slightly. Denies any sick contacts.        Past Medical History:  Diagnosis Date  . Allergy    seasonal    Patient Active Problem List   Diagnosis Date Noted  . Allergic rhinitis 03/04/2013  . Personal history of spine surgery 03/04/2013    Past Surgical History:  Procedure Laterality Date  . SPINE SURGERY     epidural  . WISDOM TOOTH EXTRACTION         Home Medications    Prior to Admission medications   Medication Sig Start Date End Date Taking? Authorizing Provider  cyclobenzaprine (FLEXERIL) 10 MG tablet Take 1 tablet (10 mg total) by mouth 3 (three) times daily as needed for muscle spasms. Patient not taking: Reported on 06/23/2015 12/24/13   Carmelina Dane, MD  HYDROcodone-acetaminophen [facility]) 5-325 MG per tablet Take 1 tablet by mouth every 4 (four) hours as needed. Patient not taking: Reported on 06/23/2015 12/24/13   Carmelina Dane, MD    lidocaine (XYLOCAINE) 2 % solution Use as directed 20 mLs in the mouth or throat as needed for mouth pain. 06/23/15   Emi Belfast, FNP  triamcinolone (KENALOG) 0.1 % paste Use as directed 1 application in the mouth or throat 2 (two) times daily. Until resolved (maximum 7 day) 06/23/15   Emi Belfast, FNP    Family History Family History  Problem Relation Age of Onset  . Heart disease Maternal Grandfather     Social History Social History  Substance Use Topics  . Smoking status: Former Games developer  . Smokeless tobacco: Not on file  . Alcohol use Yes     Allergies   Review of patient's allergies indicates no known allergies.   Review of Systems Review of Systems  Constitutional: Negative for chills and fever.  HENT: Positive for sore throat. Negative for congestion, ear pain, rhinorrhea and trouble swallowing.   Eyes: Negative for pain and discharge.  Respiratory: Negative for cough and shortness of breath.   Cardiovascular: Negative for chest pain and palpitations.  Gastrointestinal: Negative for abdominal pain, diarrhea, nausea and vomiting.  Genitourinary: Negative for flank pain, frequency, hematuria and urgency.  Musculoskeletal: Negative for myalgias and neck pain.  Neurological: Negative for dizziness, syncope, weakness, light-headedness, numbness and headaches.  All other systems reviewed and are negative.    Physical Exam Updated Vital Signs BP 132/81   Pulse 84   Temp 98.6 F (37 C) (Oral)   Resp 16  SpO2 99%   Physical Exam  Constitutional: He appears well-developed and well-nourished. No distress.  HENT:  Head: Normocephalic and atraumatic.  Right Ear: External ear normal.  Left Ear: External ear normal.  Mouth/Throat: Uvula is midline and mucous membranes are normal. Posterior oropharyngeal edema and posterior oropharyngeal erythema present. No oropharyngeal exudate or tonsillar abscesses. Tonsils are 2+ on the right. Tonsils are 2+ on the left. No  tonsillar exudate.  Eyes: Right eye exhibits no discharge. Left eye exhibits no discharge. No scleral icterus.  Neck: Normal range of motion. Neck supple. No thyromegaly present.  Cardiovascular: Normal rate, regular rhythm and normal heart sounds.   Pulmonary/Chest: Effort normal and breath sounds normal. No respiratory distress.  Abdominal: Soft. Bowel sounds are normal.  Musculoskeletal: Normal range of motion.  Lymphadenopathy:    He has no cervical adenopathy.  Neurological: He is alert.  Skin: Capillary refill takes less than 2 seconds. No pallor.  Diffuse erythematous maculopapular, scaly lesion located on flexor surfaces, arms and torso. Spares palms and face.   Nursing note and vitals reviewed.    ED Treatments / Results  Labs (all labs ordered are listed, but only abnormal results are displayed) Labs Reviewed  RAPID STREP SCREEN (NOT AT [facility])  CULTURE, GROUP A STREP [facility]    EKG  EKG Interpretation None       Radiology No results found.  Procedures Procedures (including critical care time)  Medications Ordered in ED Medications - No data to display   Initial Impression / Assessment and Plan / ED Course  I have reviewed the triage vital signs and the nursing notes.  Pertinent labs & imaging results that were available during my care of the patient were reviewed by me and considered in my medical decision making (see chart for details).  Clinical Course  Patient presents for sore throat. Has been treated for strep throat with Augmentin 2 in June and July. Rapid strep negative in ED. Oropharynx exam shows mild erythema and edema. No exudate appreciated. No uvula deviation. Low suspicion for retropharyngeal abscess. No cervical lymphadenopathy appreciated. Denies fevers. Low suspicion for mono. Denies sick contacts. Likely viral pharyngitis. Sent strep culture. Patient informed culture pending. Symptomatic treatment. Follow up with ENT if symptoms worsen.  Patient told to follow wup with PCP. Given financial information for PCP. Strict return precautions given. Patient verbalized understanding. Discussed plan of care with Dr. Verdie MosherLiu. Patient discharged home with normal VS and in NAD.  Final Clinical Impressions(s) / ED Diagnoses   Final diagnoses:  Sore throat    New Prescriptions New Prescriptions   No medications on file     Rise MuKenneth T Taleen Prosser, PA-C 12/09/15 [id number]    Lavera Guiseana Duo Liu, MD 12/09/15 2017

## 2015-12-09 NOTE — ED Triage Notes (Signed)
Pt had strep throat in June and July. Treated both times with augmentin, which relieved symptoms. Starting to have sore throat again, feels similar. Pain with swallowing.

## 2015-12-11 LAB — CULTURE, GROUP A STREP (THRC)

## 2020-09-08 ENCOUNTER — Other Ambulatory Visit: Payer: Self-pay

## 2020-09-08 ENCOUNTER — Emergency Department (HOSPITAL_BASED_OUTPATIENT_CLINIC_OR_DEPARTMENT_OTHER)
Admission: EM | Admit: 2020-09-08 | Discharge: 2020-09-08 | Disposition: A | Discharge status: Home / Self Care | Payer: BC Managed Care – PPO | Attending: Emergency Medicine | Admitting: Emergency Medicine

## 2020-09-08 ENCOUNTER — Encounter (HOSPITAL_BASED_OUTPATIENT_CLINIC_OR_DEPARTMENT_OTHER): Payer: Self-pay | Admitting: Urology

## 2020-09-08 DIAGNOSIS — H9201 Otalgia, right ear: Secondary | ICD-10-CM | POA: Diagnosis present

## 2020-09-08 DIAGNOSIS — H7291 Unspecified perforation of tympanic membrane, right ear: Secondary | ICD-10-CM | POA: Diagnosis not present

## 2020-09-08 DIAGNOSIS — Z87891 Personal history of nicotine dependence: Secondary | ICD-10-CM | POA: Insufficient documentation

## 2020-09-08 MED ORDER — IBUPROFEN 600 MG PO TABS: 600.0000 mg | ORAL_TABLET | Freq: Four times a day (QID) | ORAL | 0 refills | Status: DC | PRN

## 2020-09-08 MED ORDER — CIPROFLOXACIN-DEXAMETHASONE 0.3-0.1 % OT SUSP
4.0000 [drp] | Freq: Once | OTIC | Status: AC
  Administered 2020-09-08: 4 [drp] via OTIC
  Filled 2020-09-08: qty 7.5

## 2020-09-08 MED ORDER — IBUPROFEN 800 MG PO TABS
800.0000 mg | ORAL_TABLET | Freq: Once | ORAL | Status: AC
  Administered 2020-09-08: 800 mg via ORAL
  Filled 2020-09-08: qty 1

## 2020-09-08 NOTE — ED Triage Notes (Signed)
Cleaning Right ear out yesterday with q-tip, felt like hit ear drum, Noticed blood from right ear, bleeding stopped now

## 2020-09-08 NOTE — ED Notes (Signed)
ED Provider at bedside. 

## 2020-09-08 NOTE — ED Provider Notes (Signed)
[facility] EMERGENCY DEPARTMENT Provider Note   CSN: [id number] Arrival date & time: 09/08/20  1655     History Chief Complaint  Patient presents with  . Ear Injury    Frank Collier is a 45 y.o. male with no pertinent past medical history who presents to [facility] for evaluation of an ear injury.  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 He hit a pot hole and immediately felt significant pain to his right ear and heard a loud "popping" noise. He states that since the injury, he has experienced bleeding from the right ear canal and pain with palpation of the region. He also describes impaired hearing of the right ear and a "white noise" sensation. He denies dizziness, vertigo, nausea, vomiting, diarrhea.      Past Medical History:  Diagnosis Date  . Allergy    seasonal    Patient Active Problem List   Diagnosis Date Noted  . Allergic rhinitis 03/04/2013  . Personal history of spine surgery 03/04/2013    Past Surgical History:  Procedure Laterality Date  . SPINE SURGERY     epidural  . WISDOM TOOTH EXTRACTION         Family History  Problem Relation Age of Onset  . Heart disease Maternal Grandfather     Social History   Tobacco Use  . Smoking status: Former Games developer  . Smokeless tobacco: Never Used  Substance Use Topics  . Alcohol use: Yes    Comment: occasionally   . Drug use: No    Home Medications Prior to Admission medications   Medication Sig Start Date End Date Taking? Authorizing Provider  diphenhydramine-acetaminophen (TYLENOL PM) 25-500 MG TABS tablet Take 1 tablet by mouth at bedtime as needed (for sleep).    [provider]  lidocaine (XYLOCAINE) 2 % solution Use as directed 20 mLs in the mouth or throat as needed for mouth pain. Patient not taking: Reported on 12/09/2015 06/23/15   Emi Belfast, FNP  Multiple Vitamins-Minerals (MEGA MULTI MEN) TBCR Take 2 tablets by  mouth daily with breakfast.    [provider]  triamcinolone (KENALOG) 0.1 % paste Use as directed 1 application in the mouth or throat 2 (two) times daily. Until resolved (maximum 7 day) Patient not taking: Reported on 12/09/2015 06/23/15   Emi Belfast, FNP    Allergies    Patient has no known allergies.  Review of Systems   Review of Systems  Constitutional: Negative for chills and fever.  HENT: Positive for ear discharge, ear pain and hearing loss. Negative for sore throat.   Eyes: Negative for pain and visual disturbance.  Respiratory: Negative for cough and shortness of breath.   Cardiovascular: Negative for chest pain and palpitations.  Gastrointestinal: Negative for abdominal pain, nausea and vomiting.  Genitourinary: Negative for dysuria and hematuria.  Musculoskeletal: Negative for arthralgias and back pain.  Skin: Negative for color change and rash.  Neurological: Negative for dizziness, seizures and syncope.  All other systems reviewed and are negative.  Physical Exam Updated Vital Signs BP (!) 134/103 (BP Location: Left Arm)   Pulse 77   Temp 98.3 F (36.8 C) (Oral)   Resp 18   Ht 6\' 4"  (1.93 m)   Wt 95.3 kg   SpO2 97%   BMI 25.56 kg/m   Physical Exam Vitals and nursing note reviewed.  Constitutional:      Appearance: He is well-developed.  HENT:  Head: Normocephalic and atraumatic.     Ears:     Comments: Dried blood in ear canal, perforated tympanic membrane in right ear.    Nose: Nose normal.     Mouth/Throat:     Mouth: Mucous membranes are moist.     Pharynx: Oropharynx is clear.  Eyes:     Extraocular Movements: Extraocular movements intact.     Conjunctiva/sclera: Conjunctivae normal.     Pupils: Pupils are equal, round, and reactive to light.  Cardiovascular:     Rate and Rhythm: Normal rate and regular rhythm.     Heart sounds: No murmur heard.   Pulmonary:     Effort: Pulmonary effort is normal. No respiratory distress.      Breath sounds: Normal breath sounds.  Abdominal:     Palpations: Abdomen is soft.     Tenderness: There is no abdominal tenderness.  Musculoskeletal:     Cervical back: Neck supple.  Skin:    General: Skin is warm and dry.  Neurological:     General: No focal deficit present.     Mental Status: He is alert. Mental status is at baseline.     Cranial Nerves: No cranial nerve deficit.  Psychiatric:        Mood and Affect: Mood normal.        Behavior: Behavior normal.    ED Results / Procedures / Treatments   Labs (all labs ordered are listed, but only abnormal results are displayed) Labs Reviewed - No data to display  EKG None  Radiology No results found.  Medications Ordered in ED Medications  ciprofloxacin-dexamethasone (CIPRODEX) 0.3-0.1 % OTIC (EAR) suspension 4 drop (4 drops Right EAR Given 09/08/20 1736)   ED Course  I have reviewed the triage vital signs and the nursing notes.  Pertinent labs & imaging results that were available during my care of the patient were reviewed by me and considered in my medical decision making (see chart for details).    MDM Rules/Calculators/A&P                          [name] is a pleasant, healthy 45 year old man who presents following trauma to the right ear found to have a perforated right tympanic membrane. Discussed case with on-call ENT with recommendation for Ciprodex ear drops twice daily for one week. Patient provided ENT office phone number with plan to call to schedule an appointment for evaluation in 1-2 weeks. Patient understands and agrees with plan.  Final Clinical Impression(s) / ED Diagnoses Final diagnoses:  Perforated ear drum, right    Rx / DC Orders ED Discharge Orders    None       Roylene Reason, MD 09/08/20 1741    Charlynne Pander, MD 09/13/20 [phone]

## 2020-09-08 NOTE — Discharge Instructions (Signed)
Frank Collier,  You presented for evaluation of right ear pain, hearing loss, and bloody discharge from the ear canal and found to have a perforated tympanic membrane (ear drum). We discussed your case with our ear, nose and throat physician on-call who recommend one week of Ciprodex ear drops. Please place 4 drops in the right ear twice daily for one week. Additionally, we have provided information to this discharge summary for an ENT office for you to call and schedule an appointment in one to two weeks for evaluation.  Sincerely, Dr. Jasmine December, MD

## 2020-10-04 DIAGNOSIS — H9011 Conductive hearing loss, unilateral, right ear, with unrestricted hearing on the contralateral side: Secondary | ICD-10-CM | POA: Insufficient documentation

## 2020-12-23 ENCOUNTER — Other Ambulatory Visit: Payer: Self-pay | Admitting: Otolaryngology

## 2020-12-30 ENCOUNTER — Encounter (HOSPITAL_BASED_OUTPATIENT_CLINIC_OR_DEPARTMENT_OTHER): Payer: Self-pay | Admitting: Otolaryngology

## 2020-12-30 ENCOUNTER — Other Ambulatory Visit: Payer: Self-pay

## 2021-01-09 ENCOUNTER — Other Ambulatory Visit: Payer: Self-pay

## 2021-01-09 ENCOUNTER — Ambulatory Visit (HOSPITAL_BASED_OUTPATIENT_CLINIC_OR_DEPARTMENT_OTHER): Payer: BC Managed Care – PPO | Admitting: Certified Registered"

## 2021-01-09 ENCOUNTER — Ambulatory Visit (HOSPITAL_BASED_OUTPATIENT_CLINIC_OR_DEPARTMENT_OTHER)
Admission: RE | Admit: 2021-01-09 | Discharge: 2021-01-09 | Disposition: A | Discharge status: Home / Self Care | Payer: BC Managed Care – PPO | Attending: Otolaryngology | Admitting: Otolaryngology

## 2021-01-09 ENCOUNTER — Encounter (HOSPITAL_BASED_OUTPATIENT_CLINIC_OR_DEPARTMENT_OTHER): Payer: Self-pay | Admitting: Otolaryngology

## 2021-01-09 ENCOUNTER — Encounter (HOSPITAL_BASED_OUTPATIENT_CLINIC_OR_DEPARTMENT_OTHER)
Admission: RE | Disposition: A | Discharge status: Home / Self Care | Payer: Self-pay | Source: Home / Self Care | Attending: Otolaryngology

## 2021-01-09 DIAGNOSIS — Z0389 Encounter for observation for other suspected diseases and conditions ruled out: Secondary | ICD-10-CM | POA: Diagnosis not present

## 2021-01-09 DIAGNOSIS — Z91040 Latex allergy status: Secondary | ICD-10-CM | POA: Insufficient documentation

## 2021-01-09 DIAGNOSIS — Z886 Allergy status to analgesic agent status: Secondary | ICD-10-CM | POA: Diagnosis not present

## 2021-01-09 DIAGNOSIS — Z87891 Personal history of nicotine dependence: Secondary | ICD-10-CM | POA: Diagnosis not present

## 2021-01-09 DIAGNOSIS — H7291 Unspecified perforation of tympanic membrane, right ear: Secondary | ICD-10-CM | POA: Diagnosis present

## 2021-01-09 HISTORY — DX: Other complications of anesthesia, initial encounter: T88.59XA

## 2021-01-09 SURGERY — EXAM UNDER ANESTHESIA
Anesthesia: General | Site: Ear | Laterality: Right

## 2021-01-09 MED ORDER — ONDANSETRON HCL 4 MG/2ML IJ SOLN
INTRAMUSCULAR | Status: AC
  Filled 2021-01-09: qty 2

## 2021-01-09 MED ORDER — FENTANYL CITRATE (PF) 100 MCG/2ML IJ SOLN
INTRAMUSCULAR | Status: DC | PRN
  Administered 2021-01-09: 50 ug via INTRAVENOUS

## 2021-01-09 MED ORDER — ACETAMINOPHEN 500 MG PO TABS
ORAL_TABLET | ORAL | Status: AC
  Filled 2021-01-09: qty 2

## 2021-01-09 MED ORDER — CEFAZOLIN SODIUM-DEXTROSE 2-3 GM-%(50ML) IV SOLR
INTRAVENOUS | Status: DC | PRN
  Administered 2021-01-09: 2 g via INTRAVENOUS

## 2021-01-09 MED ORDER — MIDAZOLAM HCL 2 MG/2ML IJ SOLN
INTRAMUSCULAR | Status: AC
  Filled 2021-01-09: qty 2

## 2021-01-09 MED ORDER — LACTATED RINGERS IV SOLN: INTRAVENOUS | Status: DC

## 2021-01-09 MED ORDER — ROCURONIUM BROMIDE 10 MG/ML (PF) SYRINGE
PREFILLED_SYRINGE | INTRAVENOUS | Status: AC
  Filled 2021-01-09: qty 10

## 2021-01-09 MED ORDER — OXYCODONE HCL 5 MG PO TABS: 5.0000 mg | ORAL_TABLET | Freq: Once | ORAL | Status: DC | PRN

## 2021-01-09 MED ORDER — OXYCODONE HCL 5 MG/5ML PO SOLN: 5.0000 mg | Freq: Once | ORAL | Status: DC | PRN

## 2021-01-09 MED ORDER — PROPOFOL 10 MG/ML IV BOLUS
INTRAVENOUS | Status: AC
  Filled 2021-01-09: qty 20

## 2021-01-09 MED ORDER — DEXAMETHASONE SODIUM PHOSPHATE 10 MG/ML IJ SOLN
INTRAMUSCULAR | Status: DC | PRN
  Administered 2021-01-09: 10 mg via INTRAVENOUS

## 2021-01-09 MED ORDER — LIDOCAINE 2% (20 MG/ML) 5 ML SYRINGE
INTRAMUSCULAR | Status: DC | PRN
  Administered 2021-01-09: 80 mg via INTRAVENOUS

## 2021-01-09 MED ORDER — DEXAMETHASONE SODIUM PHOSPHATE 10 MG/ML IJ SOLN
INTRAMUSCULAR | Status: AC
  Filled 2021-01-09: qty 1

## 2021-01-09 MED ORDER — PROMETHAZINE HCL 25 MG/ML IJ SOLN: 6.2500 mg | INTRAMUSCULAR | Status: DC | PRN

## 2021-01-09 MED ORDER — ONDANSETRON HCL 4 MG/2ML IJ SOLN
INTRAMUSCULAR | Status: DC | PRN
  Administered 2021-01-09: 4 mg via INTRAVENOUS

## 2021-01-09 MED ORDER — LIDOCAINE 2% (20 MG/ML) 5 ML SYRINGE
INTRAMUSCULAR | Status: AC
  Filled 2021-01-09: qty 5

## 2021-01-09 MED ORDER — PROPOFOL 10 MG/ML IV BOLUS
INTRAVENOUS | Status: DC | PRN
  Administered 2021-01-09: 200 mg via INTRAVENOUS

## 2021-01-09 MED ORDER — MEPERIDINE HCL 25 MG/ML IJ SOLN: 6.2500 mg | INTRAMUSCULAR | Status: DC | PRN

## 2021-01-09 MED ORDER — DROPERIDOL 2.5 MG/ML IJ SOLN: 0.6250 mg | Freq: Once | INTRAMUSCULAR | Status: DC | PRN

## 2021-01-09 MED ORDER — FENTANYL CITRATE (PF) 100 MCG/2ML IJ SOLN
INTRAMUSCULAR | Status: AC
  Filled 2021-01-09: qty 2

## 2021-01-09 MED ORDER — MIDAZOLAM HCL 5 MG/5ML IJ SOLN
INTRAMUSCULAR | Status: DC | PRN
  Administered 2021-01-09: 2 mg via INTRAVENOUS

## 2021-01-09 MED ORDER — FENTANYL CITRATE (PF) 100 MCG/2ML IJ SOLN: 25.0000 ug | INTRAMUSCULAR | Status: DC | PRN

## 2021-01-09 MED ORDER — ACETAMINOPHEN 500 MG PO TABS
1000.0000 mg | ORAL_TABLET | Freq: Once | ORAL | Status: AC
  Administered 2021-01-09: 1000 mg via ORAL

## 2021-01-09 SURGICAL SUPPLY — 76 items
ADH SKN CLS APL DERMABOND .7 (GAUZE/BANDAGES/DRESSINGS) ×2
APL SKNCLS STERI-STRIP NONHPOA (GAUZE/BANDAGES/DRESSINGS) ×2
BALL CTTN LRG ABS STRL LF (GAUZE/BANDAGES/DRESSINGS) ×2
BENZOIN TINCTURE PRP APPL 2/3 (GAUZE/BANDAGES/DRESSINGS) ×3 IMPLANT
BLADE CLIPPER SURG (BLADE) IMPLANT
BLADE EAR TYMPAN 2.5 60D BEAV (BLADE) ×3 IMPLANT
BLADE EYE SICKLE 84 5 BEAV (BLADE) ×3 IMPLANT
BLADE NDL 3 SS STRL (BLADE) IMPLANT
BLADE NEEDLE 3 SS STRL (BLADE) IMPLANT
BLADE SURG 15 STRL LF DISP TIS (BLADE) IMPLANT
BLADE SURG 15 STRL SS (BLADE)
BNDG CONFORM 3 STRL LF (GAUZE/BANDAGES/DRESSINGS) IMPLANT
BNDG CONFORM 4 STRL LF (GAUZE/BANDAGES/DRESSINGS) IMPLANT
BNDG GAUZE ELAST 4 BULKY (GAUZE/BANDAGES/DRESSINGS) IMPLANT
CANISTER SUCT 1200ML W/VALVE (MISCELLANEOUS) ×3 IMPLANT
CLEANER CAUTERY TIP 5X5 PAD (MISCELLANEOUS) IMPLANT
CORD BIPOLAR FORCEPS 12FT (ELECTRODE) IMPLANT
COTTONBALL LRG STERILE PKG (GAUZE/BANDAGES/DRESSINGS) ×3 IMPLANT
DECANTER SPIKE VIAL GLASS SM (MISCELLANEOUS) ×3 IMPLANT
DEPRESSOR TONGUE BLADE STERILE (MISCELLANEOUS) IMPLANT
DERMABOND ADVANCED (GAUZE/BANDAGES/DRESSINGS) ×1
DERMABOND ADVANCED .7 DNX12 (GAUZE/BANDAGES/DRESSINGS) ×2 IMPLANT
DRAIN PENROSE 12X.25 LTX STRL (MISCELLANEOUS) ×1 IMPLANT
DRAPE EENT ADH APERT 31X51 STR (DRAPES) ×3 IMPLANT
DRAPE MICROSCOPE WILD 40.5X102 (DRAPES) ×3 IMPLANT
DRAPE SURG 17X23 STRL (DRAPES) ×3 IMPLANT
DRSG CURAD 3X16 NADH (PACKING) IMPLANT
DRSG GLASSCOCK MASTOID ADT (GAUZE/BANDAGES/DRESSINGS) IMPLANT
DRSG GLASSCOCK MASTOID PED (GAUZE/BANDAGES/DRESSINGS) IMPLANT
DRSG TELFA 3X8 NADH (GAUZE/BANDAGES/DRESSINGS) IMPLANT
ELECT COATED BLADE 2.86 ST (ELECTRODE) ×3 IMPLANT
ELECT REM PT RETURN 9FT ADLT (ELECTROSURGICAL) ×3
ELECTRODE REM PT RTRN 9FT ADLT (ELECTROSURGICAL) ×2 IMPLANT
GAUZE 4X4 16PLY ~~LOC~~+RFID DBL (SPONGE) IMPLANT
GAUZE PACKING 1/2X5YD (GAUZE/BANDAGES/DRESSINGS) IMPLANT
GAUZE SPONGE 4X4 12PLY STRL (GAUZE/BANDAGES/DRESSINGS) IMPLANT
GAUZE SPONGE 4X4 12PLY STRL LF (GAUZE/BANDAGES/DRESSINGS) IMPLANT
GAUZE VASELINE FOILPK 1/2 X 72 (GAUZE/BANDAGES/DRESSINGS) IMPLANT
GLOVE SURG ENC MOIS LTX SZ7.5 (GLOVE) ×3 IMPLANT
GOWN STRL REUS W/ TWL LRG LVL3 (GOWN DISPOSABLE) ×4 IMPLANT
GOWN STRL REUS W/TWL LRG LVL3 (GOWN DISPOSABLE) ×6
IV CATH AUTO 14GX1.75 SAFE ORG (IV SOLUTION) IMPLANT
IV NS 500ML (IV SOLUTION) ×3
IV NS 500ML BAXH (IV SOLUTION) ×2 IMPLANT
IV SET EXT 30 76VOL 4 MALE LL (IV SETS) ×3 IMPLANT
NDL HYPO 25X1 1.5 SAFETY (NEEDLE) ×1 IMPLANT
NDL SAFETY ECLIPSE 18X1.5 (NEEDLE) ×2 IMPLANT
NEEDLE HYPO 18GX1.5 SHARP (NEEDLE) ×3
NEEDLE HYPO 25X1 1.5 SAFETY (NEEDLE) ×3 IMPLANT
NS IRRIG 1000ML POUR BTL (IV SOLUTION) ×3 IMPLANT
PACK BASIN DAY SURGERY FS (CUSTOM PROCEDURE TRAY) ×3 IMPLANT
PACK ENT DAY SURGERY (CUSTOM PROCEDURE TRAY) ×3 IMPLANT
PAD CLEANER CAUTERY TIP 5X5 (MISCELLANEOUS)
PAD DRESSING TELFA 3X8 NADH (GAUZE/BANDAGES/DRESSINGS) IMPLANT
PENCIL SMOKE EVACUATOR (MISCELLANEOUS) ×3 IMPLANT
PUNCH BIOPSY DERMAL 4MM (MISCELLANEOUS) IMPLANT
SHEET MEDIUM DRAPE 40X70 STRL (DRAPES) IMPLANT
SLEEVE SCD COMPRESS KNEE MED (STOCKING) IMPLANT
SPONGE SURGIFOAM ABS GEL 12-7 (HEMOSTASIS) IMPLANT
STAPLER VISISTAT 35W (STAPLE) IMPLANT
SUT ETHILON 2 0 FS 18 (SUTURE) IMPLANT
SUT ETHILON 5 0 P 3 18 (SUTURE)
SUT NYLON ETHILON 5-0 P-3 1X18 (SUTURE) IMPLANT
SUT PLAIN 5 0 P 3 18 (SUTURE) IMPLANT
SUT PLAIN 6 0 TG1408 (SUTURE) IMPLANT
SUT PROLENE 5 0 PS 2 (SUTURE) IMPLANT
SUT SILK 3 0 SH 30 (SUTURE) IMPLANT
SUT VIC AB 3-0 FS2 27 (SUTURE) IMPLANT
SUT VIC AB 5-0 P-3 18X BRD (SUTURE) IMPLANT
SUT VIC AB 5-0 P3 18 (SUTURE)
SUT VICRYL 4-0 PS2 18IN ABS (SUTURE) ×3 IMPLANT
SYR 5ML LL (SYRINGE) IMPLANT
SYR BULB EAR ULCER 3OZ GRN STR (SYRINGE) IMPLANT
TAPE UMBILICAL 1/8 X36 TWILL (MISCELLANEOUS) IMPLANT
TOWEL GREEN STERILE FF (TOWEL DISPOSABLE) ×6 IMPLANT
TRAY DSU PREP LF (CUSTOM PROCEDURE TRAY) ×3 IMPLANT

## 2021-01-09 NOTE — Transfer of Care (Signed)
Immediate Anesthesia Transfer of Care Note  Patient: Frank Collier  Procedure(s) Performed: RIGHT TYMPANOPLASTY (Right)  Patient Location: PACU  Anesthesia Type:General  Level of Consciousness: drowsy  Airway & Oxygen Therapy: Patient Spontanous Breathing and Patient connected to face mask oxygen  Post-op Assessment: Report given to RN and Post -op Vital signs reviewed and stable  Post vital signs: Reviewed and stable  Last Vitals:  Vitals Value Taken Time  BP 146/101 01/09/21 1050  Temp    Pulse 77 01/09/21 1053  Resp 9 01/09/21 1053  SpO2 100 % 01/09/21 1053  Vitals shown include unvalidated device data.  Last Pain:  Vitals:   01/09/21 0838  TempSrc: Oral  PainSc: 0-No pain         Complications: No notable events documented.

## 2021-01-09 NOTE — H&P (Signed)
Frank Collier is an 45 y.o. male.   Chief Complaint: Right TM perforation HPI: 45 year old male with right TM perforation from cotton swab injury.  Past Medical History:  Diagnosis Date   Allergy    seasonal   Complication of anesthesia    slept for 2 days    Past Surgical History:  Procedure Laterality Date   WISDOM TOOTH EXTRACTION      Family History  Problem Relation Age of Onset   Heart disease Maternal Grandfather    Social History:  reports that he has quit smoking. He has never used smokeless tobacco. He reports current alcohol use. He reports that he does not use drugs.  Allergies:  Allergies  Allergen Reactions   Latex Dermatitis    Raw red skin   Nsaids Rash    Arethema multiforma    Medications Prior to Admission  Medication Sig Dispense Refill   diphenhydramine-acetaminophen (TYLENOL PM) 25-500 MG TABS tablet Take 1 tablet by mouth at bedtime as needed (for sleep).     Multiple Vitamins-Minerals (MEGA MULTI MEN) TBCR Take 2 tablets by mouth daily with breakfast.      No results found for this or any previous visit (from the past 48 hour(s)). No results found.  Review of Systems  All other systems reviewed and are negative.  Blood pressure 123/84, pulse 79, temperature 97.7 F (36.5 C), temperature source Oral, resp. rate 15, height 6\' 4"  (1.93 m), weight 95.5 kg, SpO2 100 %. Physical Exam Constitutional:      Appearance: Normal appearance. He is normal weight.  HENT:     Head: Normocephalic and atraumatic.     Right Ear: External ear normal.     Left Ear: External ear normal.     Nose: Nose normal.     Mouth/Throat:     Mouth: Mucous membranes are moist.     Pharynx: Oropharynx is clear.  Eyes:     Extraocular Movements: Extraocular movements intact.     Conjunctiva/sclera: Conjunctivae normal.     Pupils: Pupils are equal, round, and reactive to light.  Cardiovascular:     Rate and Rhythm: Normal rate.  Pulmonary:     Effort: Pulmonary  effort is normal.  Musculoskeletal:     Cervical back: Normal range of motion.  Skin:    General: Skin is warm and dry.  Neurological:     General: No focal deficit present.     Mental Status: He is alert and oriented to person, place, and time.  Psychiatric:        Mood and Affect: Mood normal.        Behavior: Behavior normal.        Thought Content: Thought content normal.        Judgment: Judgment normal.     Assessment/Plan Right TM perforation  To OR for right tympanoplasty.  , MD 01/09/2021, 9:53 AM

## 2021-01-09 NOTE — Anesthesia Postprocedure Evaluation (Signed)
Anesthesia Post Note  Patient: Frank Collier  Procedure(s) Performed: Exam right ear under anesthesia (Right: Ear)     Patient location during evaluation: PACU Anesthesia Type: General Level of consciousness: awake and alert Pain management: pain level controlled Vital Signs Assessment: post-procedure vital signs reviewed and stable Respiratory status: spontaneous breathing, nonlabored ventilation and respiratory function stable Cardiovascular status: blood pressure returned to baseline and stable Postop Assessment: no apparent nausea or vomiting Anesthetic complications: no   No notable events documented.  Last Vitals:  Vitals:   01/09/21 1115 01/09/21 1130  BP: 119/83 136/90  Pulse: 75 81  Resp: 15 16  Temp:  (!) 36.3 C  SpO2: 99% 98%    Last Pain:  Vitals:   01/09/21 1130  TempSrc:   PainSc: 0-No pain                 [name]

## 2021-01-09 NOTE — Op Note (Signed)
Preop diagnosis: Right tympanic membrane perforation Postop diagnosis: Healed right tympanic membrane Procedure: Right ear exam under anesthesia Surgeon: Jenne Pane Anesth: General Compl: None Findings: Central perforation healed with thin segment.  I discussed this finding intraoperatively with his wife and options of aborting the surgery versus proceeding with a middle ear exploration in order to evaluate the ossicles for injury.  We agreed that, being that we are unclear of his current hearing status, we would abort the case and plan hearing testing in a couple of weeks. Description:  After discussing risks, benefits, and alternatives, the patient was brought to the operative suite and placed on the operative table in the supine position.  Anesthesia was induced and the patient was intubated with an LMA without difficulty.  The bed was turned 90 degrees from Anesthesia and the right ear was inspected under the operating microscope using an ear speculum.  Cerumen was removed with a curette.  Findings are noted above.  The patient was then turned back to Anesthesia for wake up and was extubated and moved to the recovery room in stable condition.

## 2021-01-09 NOTE — Anesthesia Procedure Notes (Signed)
Procedure Name: LMA Insertion Date/Time: 01/09/2021 10:14 AM Performed by: Lauralyn Primes, CRNA Pre-anesthesia Checklist: Patient identified, Emergency Drugs available, Suction available and Patient being monitored Patient Re-evaluated:Patient Re-evaluated prior to induction Oxygen Delivery Method: Circle system utilized Preoxygenation: Pre-oxygenation with 100% oxygen Induction Type: IV induction Ventilation: Mask ventilation without difficulty LMA: LMA inserted LMA Size: 5.0 Number of attempts: 1 Airway Equipment and Method: Bite block Placement Confirmation: positive ETCO2 Tube secured with: Tape Dental Injury: Teeth and Oropharynx as per pre-operative assessment

## 2021-01-09 NOTE — Anesthesia Preprocedure Evaluation (Addendum)
Anesthesia Evaluation  Patient identified by MRN, date of birth, ID band Patient awake    Reviewed: Allergy & Precautions, NPO status , Patient's Chart, lab work & pertinent test results  Airway Mallampati: II  TM Distance: >3 FB Neck ROM: Full    Dental no notable dental hx.    Pulmonary neg pulmonary ROS, former smoker,    Pulmonary exam normal breath sounds clear to auscultation       Cardiovascular negative cardio ROS Normal cardiovascular exam Rhythm:Regular Rate:Normal     Neuro/Psych negative neurological ROS  negative psych ROS   GI/Hepatic negative GI ROS, Neg liver ROS,   Endo/Other  negative endocrine ROS  Renal/GU negative Renal ROS  negative genitourinary   Musculoskeletal negative musculoskeletal ROS (+)   Abdominal   Peds negative pediatric ROS (+)  Hematology negative hematology ROS (+)   Anesthesia Other Findings   Reproductive/Obstetrics negative OB ROS                            Anesthesia Physical Anesthesia Plan  ASA: 2  Anesthesia Plan: General   Post-op Pain Management:    Induction: Intravenous  PONV Risk Score and Plan: 2 and Treatment may vary due to age or medical condition, Ondansetron, Midazolam and Dexamethasone  Airway Management Planned: LMA  Additional Equipment: None  Intra-op Plan:   Post-operative Plan: Extubation in OR  Informed Consent: I have reviewed the patients History and Physical, chart, labs and discussed the procedure including the risks, benefits and alternatives for the proposed anesthesia with the patient or authorized representative who has indicated his/her understanding and acceptance.     Dental advisory given  Plan Discussed with: Anesthesiologist, CRNA and Surgeon  Anesthesia Plan Comments:       Anesthesia Quick Evaluation

## 2021-01-09 NOTE — Discharge Instructions (Signed)
Tylenol 1000 mg given at 0844.  Post Anesthesia Home Care Instructions  Activity: Get plenty of rest for the remainder of the day. A responsible individual must stay with you for 24 hours following the procedure.  For the next 24 hours, DO NOT: -Drive a car -Advertising copywriter -Drink alcoholic beverages -Take any medication unless instructed by your physician -Make any legal decisions or sign important papers.  Meals: Start with liquid foods such as gelatin or soup. Progress to regular foods as tolerated. Avoid greasy, spicy, heavy foods. If nausea and/or vomiting occur, drink only clear liquids until the nausea and/or vomiting subsides. Call your physician if vomiting continues.  Special Instructions/Symptoms: Your throat may feel dry or sore from the anesthesia or the breathing tube placed in your throat during surgery. If this causes discomfort, gargle with warm salt water. The discomfort should disappear within 24 hours.  If you had a scopolamine patch placed behind your ear for the management of post- operative nausea and/or vomiting:  1. The medication in the patch is effective for 72 hours, after which it should be removed.  Wrap patch in a tissue and discard in the trash. Wash hands thoroughly with soap and water. 2. You may remove the patch earlier than 72 hours if you experience unpleasant side effects which may include dry mouth, dizziness or visual disturbances. 3. Avoid touching the patch. Wash your hands with soap and water after contact with the patch.

## 2021-01-09 NOTE — Brief Op Note (Signed)
01/09/2021  10:59 AM  PATIENT:  Frank Collier  45 y.o. male  PRE-OPERATIVE DIAGNOSIS:  Traumatic tympanic membrane perforation, right  POST-OPERATIVE DIAGNOSIS:  Traumatic tympanic membrane perforation, right, healed  PROCEDURE:  Procedure(s): Exam right ear under anesthesia (Right)  SURGEON:  Surgeon(s) and Role:    Christia Reading, MD - Primary  PHYSICIAN ASSISTANT:   ASSISTANTS: none   ANESTHESIA:   general  EBL:  None   BLOOD ADMINISTERED:none  DRAINS: none   LOCAL MEDICATIONS USED:  NONE  SPECIMEN:  No Specimen  DISPOSITION OF SPECIMEN:  N/A  COUNTS:  YES  TOURNIQUET:  * No tourniquets in log *  DICTATION: .Note written in EPIC  PLAN OF CARE: Discharge to home after PACU  PATIENT DISPOSITION:  PACU - hemodynamically stable.   Delay start of Pharmacological VTE agent (>24hrs) due to surgical blood loss or risk of bleeding: no

## 2021-05-22 ENCOUNTER — Encounter: Payer: Self-pay | Admitting: Podiatry

## 2021-05-22 ENCOUNTER — Ambulatory Visit: Payer: BC Managed Care – PPO | Admitting: Podiatry

## 2021-05-22 ENCOUNTER — Ambulatory Visit (INDEPENDENT_AMBULATORY_CARE_PROVIDER_SITE_OTHER): Payer: BC Managed Care – PPO

## 2021-05-22 ENCOUNTER — Other Ambulatory Visit: Payer: Self-pay

## 2021-05-22 DIAGNOSIS — Q6671 Congenital pes cavus, right foot: Secondary | ICD-10-CM

## 2021-05-22 DIAGNOSIS — Q6672 Congenital pes cavus, left foot: Secondary | ICD-10-CM

## 2021-05-22 NOTE — Progress Notes (Signed)
°  Subjective:  Patient ID: Frank Collier, male    DOB: 21-May-1975,   MRN: [id number]  Chief Complaint  Patient presents with   Foot Orthotics    New patient - interested in orthotics    46 y.o. male presents for bilateral foot pain. Relates he is interested in new orthotics. He had a friend get some orthotics that were very helpful. Relates he is starting a different job and will be on his feet more and want to get some support to help with the transition. Denies any pain today in his feet . Denies any other pedal complaints. Denies n/v/f/c.   Past Medical History:  Diagnosis Date   Allergy    seasonal   Complication of anesthesia    slept for 2 days    Objective:  Physical Exam: Vascular: DP/PT pulses 2/4 bilateral. CFT <3 seconds. Normal hair growth on digits. No edema.  Skin. No lacerations or abrasions bilateral feet.  Musculoskeletal: MMT 5/5 bilateral lower extremities in DF, PF, Inversion and Eversion. Deceased ROM in DF of ankle joint. No tenderness to palpation. Slightly cavus type foot with more wear on the lateral aspect of the heel of his shoes.  Neurological: Sensation intact to light touch.   Assessment:   1. Pes cavus of both feet      Plan:  Patient was evaluated and treated and all questions answered. -Discussed treatement options; discussed pes cavus deformity;conservative treatment with orthotics.  -Will fit for custom molded orthotics today.  -Recommend good supportive shoes -Recommend daily stretching and icing -Patient to return to office as needed or sooner if condition worsens.   Louann Sjogren, DPM

## 2021-05-22 NOTE — Progress Notes (Signed)
SITUATION Reason for Consult: Evaluation for Bilateral Custom Foot Orthoses Patient / Caregiver Report: Patient is ready for foot orthotics  OBJECTIVE DATA: Patient History / Diagnosis:    ICD-10-CM   1. Pes cavus of both feet  Q66.71    Q66.72       Current or Previous Devices: None and no history  Foot Examination: Skin presentation:   Intact Ulcers & Callousing:   None and no history Toe / Foot Deformities:  Pes Cavus Weight Bearing Presentation:  Pes Cavus Sensation:    Intact  ORTHOTIC RECOMMENDATION Recommended Device: 1x pair of custom functional foot orthotics  GOALS OF ORTHOSES - Reduce Pain - Prevent Foot Deformity - Prevent Progression of Further Foot Deformity - Relieve Pressure - Improve the Overall Biomechanical Function of the Foot and Lower Extremity.  ACTIONS PERFORMED Patient was casted for Foot Orthoses via crush box. Procedure was explained and patient tolerated procedure well. All questions were answered and concerns addressed.  PLAN Potential out of pocket cost was communicated to patient. Casts are to be sent to [facility] for fabrication. Patient is to be called for fitting when devices are ready.

## 2021-05-29 DIAGNOSIS — D2271 Melanocytic nevi of right lower limb, including hip: Secondary | ICD-10-CM | POA: Diagnosis not present

## 2021-05-29 DIAGNOSIS — D485 Neoplasm of uncertain behavior of skin: Secondary | ICD-10-CM | POA: Diagnosis not present

## 2021-05-29 DIAGNOSIS — D225 Melanocytic nevi of trunk: Secondary | ICD-10-CM | POA: Diagnosis not present

## 2021-05-29 DIAGNOSIS — D2262 Melanocytic nevi of left upper limb, including shoulder: Secondary | ICD-10-CM | POA: Diagnosis not present

## 2021-05-29 DIAGNOSIS — D2261 Melanocytic nevi of right upper limb, including shoulder: Secondary | ICD-10-CM | POA: Diagnosis not present

## 2021-05-29 DIAGNOSIS — D2272 Melanocytic nevi of left lower limb, including hip: Secondary | ICD-10-CM | POA: Diagnosis not present

## 2021-06-14 ENCOUNTER — Other Ambulatory Visit: Payer: Self-pay

## 2021-06-14 ENCOUNTER — Ambulatory Visit: Payer: BC Managed Care – PPO

## 2021-06-14 DIAGNOSIS — Q6671 Congenital pes cavus, right foot: Secondary | ICD-10-CM

## 2021-06-14 DIAGNOSIS — Q6672 Congenital pes cavus, left foot: Secondary | ICD-10-CM

## 2021-06-14 NOTE — Progress Notes (Signed)
SITUATION: ?Reason for Visit: Fitting and Delivery of Custom Fabricated Foot Orthoses ?Patient Report: Patient reports comfort and is satisfied with device. ? ?OBJECTIVE DATA: ?Patient History / Diagnosis:   ?  ICD-10-CM   ?1. Pes cavus of both feet  Q66.71   ? Q66.72   ?  ? ? ?Provided Device:  Custom Functional Foot Orthotics ?    [id number] ? ?GOAL OF ORTHOSIS ?- Improve gait ?- Decrease energy expenditure ?- Improve Balance ?- Provide Triplanar stability of foot complex ?- Facilitate motion ? ?ACTIONS PERFORMED ?Patient was fit with foot orthotics trimmed to shoe last. Patient tolerated fittign procedure.  ? ?Patient was provided with verbal and written instruction and demonstration regarding donning, doffing, wear, care, proper fit, function, purpose, cleaning, and use of the orthosis and in all related precautions and risks and benefits regarding the orthosis. ? ?Patient was also provided with verbal instruction regarding how to report any failures or malfunctions of the orthosis and necessary follow up care. Patient was also instructed to contact our office regarding any change in status that may affect the function of the orthosis. ? ?Patient demonstrated independence with proper donning, doffing, and fit and verbalized understanding of all instructions. ? ?PLAN: ?Patient is to follow up in one week or as necessary (PRN). All questions were answered and concerns addressed. Plan of care was discussed with and agreed upon by the patient. ? ?

## 2021-08-14 DIAGNOSIS — R1012 Left upper quadrant pain: Secondary | ICD-10-CM | POA: Diagnosis not present

## 2021-08-18 DIAGNOSIS — Z13220 Encounter for screening for lipoid disorders: Secondary | ICD-10-CM | POA: Diagnosis not present

## 2021-08-18 DIAGNOSIS — R1012 Left upper quadrant pain: Secondary | ICD-10-CM | POA: Diagnosis not present

## 2021-08-18 DIAGNOSIS — Z0000 Encounter for general adult medical examination without abnormal findings: Secondary | ICD-10-CM | POA: Diagnosis not present

## 2021-08-25 DIAGNOSIS — R1012 Left upper quadrant pain: Secondary | ICD-10-CM | POA: Diagnosis not present

## 2021-08-25 DIAGNOSIS — K921 Melena: Secondary | ICD-10-CM | POA: Diagnosis not present

## 2021-08-25 DIAGNOSIS — G479 Sleep disorder, unspecified: Secondary | ICD-10-CM | POA: Diagnosis not present

## 2021-08-28 DIAGNOSIS — R1012 Left upper quadrant pain: Secondary | ICD-10-CM | POA: Diagnosis not present

## 2021-09-15 DIAGNOSIS — Z8379 Family history of other diseases of the digestive system: Secondary | ICD-10-CM | POA: Diagnosis not present

## 2021-09-15 DIAGNOSIS — Z1211 Encounter for screening for malignant neoplasm of colon: Secondary | ICD-10-CM | POA: Diagnosis not present

## 2021-09-15 DIAGNOSIS — R1032 Left lower quadrant pain: Secondary | ICD-10-CM | POA: Diagnosis not present

## 2021-09-15 DIAGNOSIS — K921 Melena: Secondary | ICD-10-CM | POA: Diagnosis not present

## 2021-09-18 DIAGNOSIS — R634 Abnormal weight loss: Secondary | ICD-10-CM | POA: Diagnosis not present

## 2021-09-18 DIAGNOSIS — G479 Sleep disorder, unspecified: Secondary | ICD-10-CM | POA: Diagnosis not present

## 2021-09-25 DIAGNOSIS — K645 Perianal venous thrombosis: Secondary | ICD-10-CM | POA: Diagnosis not present

## 2021-09-26 DIAGNOSIS — K645 Perianal venous thrombosis: Secondary | ICD-10-CM | POA: Diagnosis not present

## 2021-09-28 ENCOUNTER — Emergency Department (HOSPITAL_BASED_OUTPATIENT_CLINIC_OR_DEPARTMENT_OTHER)
Admission: EM | Admit: 2021-09-28 | Discharge: 2021-09-28 | Disposition: A | Discharge status: Home / Self Care | Payer: BC Managed Care – PPO | Attending: Emergency Medicine | Admitting: Emergency Medicine

## 2021-09-28 ENCOUNTER — Encounter (HOSPITAL_BASED_OUTPATIENT_CLINIC_OR_DEPARTMENT_OTHER): Payer: Self-pay | Admitting: Urology

## 2021-09-28 DIAGNOSIS — K648 Other hemorrhoids: Secondary | ICD-10-CM | POA: Diagnosis not present

## 2021-09-28 DIAGNOSIS — K6289 Other specified diseases of anus and rectum: Secondary | ICD-10-CM | POA: Diagnosis not present

## 2021-09-28 NOTE — Discharge Instructions (Signed)
Please follow-up with your general surgeon.  Keep the area clean and dry.  I recommend using stool softener such as MiraLAX you may titrate up on this until you have soft stool.  If you do have another episode of bleeding I recommend holding pressure with some gauze as we discussed.

## 2021-09-28 NOTE — ED Provider Notes (Signed)
[facility] EMERGENCY DEPARTMENT Provider Note   CSN: [id number] Arrival date & time: 09/28/21  1754     History  Chief Complaint  Patient presents with   Hemorrhoids    Continued bleeding since this am     Frank Collier is a 46 y.o. male.  HPI Patient is a 46 year old male presented emergency room today with complaints of rectal pain.  Seems that he has had a thrombosed hemorrhoid was seen by gastroenterology 6/13 and has been undergoing conservative therapy.   States that this morning he felt a pop and noticed significant bleeding.  He states that he has not had any nausea vomiting abdominal pain chest pain or any other associated symptoms.  He states he feels well currently and the bleeding has stopped.  He is going out of town tomorrow and wanted to have a checkup before leaving town.  No lightheadedness dizziness chest pain or shortness of breath     Home Medications Prior to Admission medications   Medication Sig Start Date End Date Taking? Authorizing Provider  diphenhydramine-acetaminophen (TYLENOL PM) 25-500 MG TABS tablet Take 1 tablet by mouth at bedtime as needed (for sleep).    [provider]  fluocinonide (LIDEX) 0.05 % external solution Apply 1 application topically 3 (three) times a week. 01/18/21   [provider]  Multiple Vitamins-Minerals (MEGA MULTI MEN) TBCR Take 2 tablets by mouth daily with breakfast.    [provider]  mupirocin ointment (BACTROBAN) 2 % Apply 1 application topically daily. 01/18/21   [provider]  triamcinolone cream (KENALOG) 0.1 % Apply 1 application topically 2 (two) times daily. 01/18/21   [provider]      Allergies    Patient has no known allergies.    Review of Systems   Review of Systems  Physical Exam Updated Vital Signs BP 112/64 (BP Location: Left Arm)   Pulse 71   Temp 97.7 F (36.5 C) (Oral)   Resp 18   Ht 6\' 4"  (1.93 m)   Wt 85.3 kg   SpO2 100%   BMI  22.88 kg/m  Physical Exam Vitals and nursing note reviewed.  Constitutional:      General: He is not in acute distress.    Appearance: Normal appearance. He is not ill-appearing.  HENT:     Head: Normocephalic and atraumatic.  Eyes:     General: No scleral icterus.       Right eye: No discharge.        Left eye: No discharge.     Conjunctiva/sclera: Conjunctivae normal.  Pulmonary:     Effort: Pulmonary effort is normal.     Breath sounds: No stridor.  Genitourinary:    Comments: Thrombosed hemorrhoid not actively bleeding.  Some dried blood surrounding the anus.  No fluctuant abscesses or tenderness to palpation. Neurological:     Mental Status: He is alert and oriented to person, place, and time. Mental status is at baseline.     ED Results / Procedures / Treatments   Labs (all labs ordered are listed, but only abnormal results are displayed) Labs Reviewed - No data to display  EKG None  Radiology No results found.  Procedures Procedures    Medications Ordered in ED Medications - No data to display  ED Course/ Medical Decision Making/ A&P                           Medical Decision Making  Patient is a 46 year old male presented emergency room today with complaints of rectal pain.  Seems that he has had a thrombosed hemorrhoid was seen by gastroenterology 6/13 and has been undergoing conservative therapy.   States that this morning he felt a pop and noticed significant bleeding.  He states that he has not had any nausea vomiting abdominal pain chest pain or any other associated symptoms.  He states he feels well currently and the bleeding has stopped.  He is going out of town tomorrow and wanted to have a checkup before leaving town.  No lightheadedness dizziness chest pain or shortness of breath  Physical exam with thrombosed hemorrhoid with no active bleeding.  Briefly discussed my attending physician.  Will discharge home at this time.  We will follow-up  with gastroenterology or general surgery.  Return precautions discussed.   Final Clinical Impression(s) / ED Diagnoses Final diagnoses:  Other hemorrhoids    Rx / DC Orders ED Discharge Orders     None         Gailen Shelter, [place] 09/28/21 2144    Sloan Leiter, DO 09/30/21 0129

## 2021-09-28 NOTE — ED Triage Notes (Signed)
Developed  hemorrhoid Saturday Saw GI and suggested to go to surgeon, surgeon did not lance it It broke this am, continued bleeding, dark red blood  States pain relieved

## 2021-10-09 ENCOUNTER — Ambulatory Visit: Payer: BC Managed Care – PPO | Admitting: Physician Assistant

## 2021-10-11 DIAGNOSIS — K921 Melena: Secondary | ICD-10-CM | POA: Diagnosis not present

## 2021-10-11 DIAGNOSIS — Z1211 Encounter for screening for malignant neoplasm of colon: Secondary | ICD-10-CM | POA: Diagnosis not present

## 2021-10-22 DIAGNOSIS — K635 Polyp of colon: Secondary | ICD-10-CM | POA: Diagnosis not present

## 2021-11-02 DIAGNOSIS — R140 Abdominal distension (gaseous): Secondary | ICD-10-CM | POA: Diagnosis not present

## 2021-11-02 DIAGNOSIS — Z1211 Encounter for screening for malignant neoplasm of colon: Secondary | ICD-10-CM | POA: Diagnosis not present

## 2021-11-02 DIAGNOSIS — K648 Other hemorrhoids: Secondary | ICD-10-CM | POA: Diagnosis not present

## 2021-11-02 DIAGNOSIS — R1012 Left upper quadrant pain: Secondary | ICD-10-CM | POA: Diagnosis not present

## 2021-12-13 DIAGNOSIS — R195 Other fecal abnormalities: Secondary | ICD-10-CM | POA: Diagnosis not present

## 2021-12-13 DIAGNOSIS — R1084 Generalized abdominal pain: Secondary | ICD-10-CM | POA: Diagnosis not present

## 2021-12-13 DIAGNOSIS — R634 Abnormal weight loss: Secondary | ICD-10-CM | POA: Diagnosis not present

## 2021-12-13 DIAGNOSIS — R1012 Left upper quadrant pain: Secondary | ICD-10-CM | POA: Diagnosis not present

## 2021-12-13 DIAGNOSIS — R140 Abdominal distension (gaseous): Secondary | ICD-10-CM | POA: Diagnosis not present

## 2022-02-03 DIAGNOSIS — K580 Irritable bowel syndrome with diarrhea: Secondary | ICD-10-CM | POA: Diagnosis not present

## 2022-02-03 DIAGNOSIS — R0781 Pleurodynia: Secondary | ICD-10-CM | POA: Diagnosis not present

## 2022-02-03 DIAGNOSIS — K648 Other hemorrhoids: Secondary | ICD-10-CM | POA: Diagnosis not present

## 2022-02-07 DIAGNOSIS — K648 Other hemorrhoids: Secondary | ICD-10-CM | POA: Diagnosis not present

## 2022-02-07 DIAGNOSIS — Z1211 Encounter for screening for malignant neoplasm of colon: Secondary | ICD-10-CM | POA: Diagnosis not present

## 2022-02-19 DIAGNOSIS — K529 Noninfective gastroenteritis and colitis, unspecified: Secondary | ICD-10-CM | POA: Diagnosis not present

## 2022-03-14 DIAGNOSIS — K580 Irritable bowel syndrome with diarrhea: Secondary | ICD-10-CM | POA: Diagnosis not present

## 2022-03-14 DIAGNOSIS — K648 Other hemorrhoids: Secondary | ICD-10-CM | POA: Diagnosis not present

## 2022-05-12 DIAGNOSIS — Z79899 Other long term (current) drug therapy: Secondary | ICD-10-CM | POA: Diagnosis not present

## 2022-05-12 DIAGNOSIS — R140 Abdominal distension (gaseous): Secondary | ICD-10-CM | POA: Diagnosis not present

## 2022-05-12 DIAGNOSIS — K30 Functional dyspepsia: Secondary | ICD-10-CM | POA: Diagnosis not present

## 2022-06-20 DIAGNOSIS — D2271 Melanocytic nevi of right lower limb, including hip: Secondary | ICD-10-CM | POA: Diagnosis not present

## 2022-06-20 DIAGNOSIS — D2261 Melanocytic nevi of right upper limb, including shoulder: Secondary | ICD-10-CM | POA: Diagnosis not present

## 2022-06-20 DIAGNOSIS — D225 Melanocytic nevi of trunk: Secondary | ICD-10-CM | POA: Diagnosis not present

## 2022-06-20 DIAGNOSIS — D2262 Melanocytic nevi of left upper limb, including shoulder: Secondary | ICD-10-CM | POA: Diagnosis not present

## 2022-10-05 DIAGNOSIS — K30 Functional dyspepsia: Secondary | ICD-10-CM | POA: Diagnosis not present

## 2022-10-05 DIAGNOSIS — K5229 Other allergic and dietetic gastroenteritis and colitis: Secondary | ICD-10-CM | POA: Diagnosis not present

## 2022-10-11 DIAGNOSIS — K5229 Other allergic and dietetic gastroenteritis and colitis: Secondary | ICD-10-CM | POA: Diagnosis not present

## 2022-11-20 DIAGNOSIS — M25511 Pain in right shoulder: Secondary | ICD-10-CM | POA: Diagnosis not present

## 2022-11-22 DIAGNOSIS — M25511 Pain in right shoulder: Secondary | ICD-10-CM | POA: Diagnosis not present

## 2022-11-26 DIAGNOSIS — M25511 Pain in right shoulder: Secondary | ICD-10-CM | POA: Diagnosis not present

## 2022-12-03 DIAGNOSIS — M25511 Pain in right shoulder: Secondary | ICD-10-CM | POA: Diagnosis not present

## 2022-12-10 DIAGNOSIS — M25511 Pain in right shoulder: Secondary | ICD-10-CM | POA: Diagnosis not present

## 2022-12-10 DIAGNOSIS — M6281 Muscle weakness (generalized): Secondary | ICD-10-CM | POA: Diagnosis not present

## 2022-12-10 DIAGNOSIS — M25611 Stiffness of right shoulder, not elsewhere classified: Secondary | ICD-10-CM | POA: Diagnosis not present

## 2022-12-12 DIAGNOSIS — M25511 Pain in right shoulder: Secondary | ICD-10-CM | POA: Diagnosis not present

## 2022-12-12 DIAGNOSIS — M6281 Muscle weakness (generalized): Secondary | ICD-10-CM | POA: Diagnosis not present

## 2022-12-12 DIAGNOSIS — M25611 Stiffness of right shoulder, not elsewhere classified: Secondary | ICD-10-CM | POA: Diagnosis not present

## 2022-12-19 DIAGNOSIS — M25511 Pain in right shoulder: Secondary | ICD-10-CM | POA: Diagnosis not present

## 2022-12-19 DIAGNOSIS — M25611 Stiffness of right shoulder, not elsewhere classified: Secondary | ICD-10-CM | POA: Diagnosis not present

## 2022-12-19 DIAGNOSIS — Z0000 Encounter for general adult medical examination without abnormal findings: Secondary | ICD-10-CM | POA: Diagnosis not present

## 2022-12-19 DIAGNOSIS — Z23 Encounter for immunization: Secondary | ICD-10-CM | POA: Diagnosis not present

## 2022-12-19 DIAGNOSIS — M6281 Muscle weakness (generalized): Secondary | ICD-10-CM | POA: Diagnosis not present

## 2022-12-21 DIAGNOSIS — Z0000 Encounter for general adult medical examination without abnormal findings: Secondary | ICD-10-CM | POA: Diagnosis not present

## 2022-12-21 DIAGNOSIS — M25611 Stiffness of right shoulder, not elsewhere classified: Secondary | ICD-10-CM | POA: Diagnosis not present

## 2022-12-21 DIAGNOSIS — M25511 Pain in right shoulder: Secondary | ICD-10-CM | POA: Diagnosis not present

## 2022-12-21 DIAGNOSIS — Z13220 Encounter for screening for lipoid disorders: Secondary | ICD-10-CM | POA: Diagnosis not present

## 2022-12-21 DIAGNOSIS — M6281 Muscle weakness (generalized): Secondary | ICD-10-CM | POA: Diagnosis not present

## 2022-12-24 DIAGNOSIS — M25611 Stiffness of right shoulder, not elsewhere classified: Secondary | ICD-10-CM | POA: Diagnosis not present

## 2022-12-24 DIAGNOSIS — M6281 Muscle weakness (generalized): Secondary | ICD-10-CM | POA: Diagnosis not present

## 2022-12-24 DIAGNOSIS — M25511 Pain in right shoulder: Secondary | ICD-10-CM | POA: Diagnosis not present

## 2022-12-26 DIAGNOSIS — M25511 Pain in right shoulder: Secondary | ICD-10-CM | POA: Diagnosis not present

## 2022-12-26 DIAGNOSIS — M6281 Muscle weakness (generalized): Secondary | ICD-10-CM | POA: Diagnosis not present

## 2022-12-26 DIAGNOSIS — M25611 Stiffness of right shoulder, not elsewhere classified: Secondary | ICD-10-CM | POA: Diagnosis not present

## 2022-12-31 DIAGNOSIS — M25611 Stiffness of right shoulder, not elsewhere classified: Secondary | ICD-10-CM | POA: Diagnosis not present

## 2022-12-31 DIAGNOSIS — M6281 Muscle weakness (generalized): Secondary | ICD-10-CM | POA: Diagnosis not present

## 2022-12-31 DIAGNOSIS — M25511 Pain in right shoulder: Secondary | ICD-10-CM | POA: Diagnosis not present

## 2023-01-02 DIAGNOSIS — M25611 Stiffness of right shoulder, not elsewhere classified: Secondary | ICD-10-CM | POA: Diagnosis not present

## 2023-01-02 DIAGNOSIS — M6281 Muscle weakness (generalized): Secondary | ICD-10-CM | POA: Diagnosis not present

## 2023-01-02 DIAGNOSIS — M25511 Pain in right shoulder: Secondary | ICD-10-CM | POA: Diagnosis not present

## 2023-01-07 DIAGNOSIS — M25511 Pain in right shoulder: Secondary | ICD-10-CM | POA: Diagnosis not present

## 2023-01-07 DIAGNOSIS — M6281 Muscle weakness (generalized): Secondary | ICD-10-CM | POA: Diagnosis not present

## 2023-01-07 DIAGNOSIS — M25611 Stiffness of right shoulder, not elsewhere classified: Secondary | ICD-10-CM | POA: Diagnosis not present

## 2023-01-14 DIAGNOSIS — M25611 Stiffness of right shoulder, not elsewhere classified: Secondary | ICD-10-CM | POA: Diagnosis not present

## 2023-01-14 DIAGNOSIS — M25511 Pain in right shoulder: Secondary | ICD-10-CM | POA: Diagnosis not present

## 2023-01-21 DIAGNOSIS — M25511 Pain in right shoulder: Secondary | ICD-10-CM | POA: Diagnosis not present

## 2023-01-21 DIAGNOSIS — M25611 Stiffness of right shoulder, not elsewhere classified: Secondary | ICD-10-CM | POA: Diagnosis not present

## 2023-01-21 DIAGNOSIS — M6281 Muscle weakness (generalized): Secondary | ICD-10-CM | POA: Diagnosis not present

## 2023-01-28 DIAGNOSIS — M25611 Stiffness of right shoulder, not elsewhere classified: Secondary | ICD-10-CM | POA: Diagnosis not present

## 2023-01-28 DIAGNOSIS — M25511 Pain in right shoulder: Secondary | ICD-10-CM | POA: Diagnosis not present

## 2023-01-28 DIAGNOSIS — M6281 Muscle weakness (generalized): Secondary | ICD-10-CM | POA: Diagnosis not present

## 2023-02-04 DIAGNOSIS — M25511 Pain in right shoulder: Secondary | ICD-10-CM | POA: Diagnosis not present

## 2023-02-05 DIAGNOSIS — M9903 Segmental and somatic dysfunction of lumbar region: Secondary | ICD-10-CM | POA: Diagnosis not present

## 2023-02-05 DIAGNOSIS — M5411 Radiculopathy, occipito-atlanto-axial region: Secondary | ICD-10-CM | POA: Diagnosis not present

## 2023-02-05 DIAGNOSIS — M9901 Segmental and somatic dysfunction of cervical region: Secondary | ICD-10-CM | POA: Diagnosis not present

## 2023-02-07 DIAGNOSIS — M9901 Segmental and somatic dysfunction of cervical region: Secondary | ICD-10-CM | POA: Diagnosis not present

## 2023-02-07 DIAGNOSIS — M5411 Radiculopathy, occipito-atlanto-axial region: Secondary | ICD-10-CM | POA: Diagnosis not present

## 2023-02-07 DIAGNOSIS — M9903 Segmental and somatic dysfunction of lumbar region: Secondary | ICD-10-CM | POA: Diagnosis not present

## 2023-02-11 DIAGNOSIS — M5411 Radiculopathy, occipito-atlanto-axial region: Secondary | ICD-10-CM | POA: Diagnosis not present

## 2023-02-11 DIAGNOSIS — M9901 Segmental and somatic dysfunction of cervical region: Secondary | ICD-10-CM | POA: Diagnosis not present

## 2023-02-11 DIAGNOSIS — M9903 Segmental and somatic dysfunction of lumbar region: Secondary | ICD-10-CM | POA: Diagnosis not present

## 2023-02-13 DIAGNOSIS — M9903 Segmental and somatic dysfunction of lumbar region: Secondary | ICD-10-CM | POA: Diagnosis not present

## 2023-02-13 DIAGNOSIS — M5411 Radiculopathy, occipito-atlanto-axial region: Secondary | ICD-10-CM | POA: Diagnosis not present

## 2023-02-13 DIAGNOSIS — M9901 Segmental and somatic dysfunction of cervical region: Secondary | ICD-10-CM | POA: Diagnosis not present

## 2023-02-14 DIAGNOSIS — M9901 Segmental and somatic dysfunction of cervical region: Secondary | ICD-10-CM | POA: Diagnosis not present

## 2023-02-14 DIAGNOSIS — M9903 Segmental and somatic dysfunction of lumbar region: Secondary | ICD-10-CM | POA: Diagnosis not present

## 2023-02-14 DIAGNOSIS — M5411 Radiculopathy, occipito-atlanto-axial region: Secondary | ICD-10-CM | POA: Diagnosis not present

## 2023-02-18 DIAGNOSIS — M9901 Segmental and somatic dysfunction of cervical region: Secondary | ICD-10-CM | POA: Diagnosis not present

## 2023-02-18 DIAGNOSIS — M9903 Segmental and somatic dysfunction of lumbar region: Secondary | ICD-10-CM | POA: Diagnosis not present

## 2023-02-18 DIAGNOSIS — M5411 Radiculopathy, occipito-atlanto-axial region: Secondary | ICD-10-CM | POA: Diagnosis not present

## 2023-02-21 DIAGNOSIS — M5411 Radiculopathy, occipito-atlanto-axial region: Secondary | ICD-10-CM | POA: Diagnosis not present

## 2023-02-21 DIAGNOSIS — M9901 Segmental and somatic dysfunction of cervical region: Secondary | ICD-10-CM | POA: Diagnosis not present

## 2023-02-21 DIAGNOSIS — M9903 Segmental and somatic dysfunction of lumbar region: Secondary | ICD-10-CM | POA: Diagnosis not present

## 2023-02-25 DIAGNOSIS — M9901 Segmental and somatic dysfunction of cervical region: Secondary | ICD-10-CM | POA: Diagnosis not present

## 2023-02-25 DIAGNOSIS — M9903 Segmental and somatic dysfunction of lumbar region: Secondary | ICD-10-CM | POA: Diagnosis not present

## 2023-02-25 DIAGNOSIS — M5411 Radiculopathy, occipito-atlanto-axial region: Secondary | ICD-10-CM | POA: Diagnosis not present

## 2023-02-28 DIAGNOSIS — M5411 Radiculopathy, occipito-atlanto-axial region: Secondary | ICD-10-CM | POA: Diagnosis not present

## 2023-02-28 DIAGNOSIS — M9903 Segmental and somatic dysfunction of lumbar region: Secondary | ICD-10-CM | POA: Diagnosis not present

## 2023-02-28 DIAGNOSIS — M9901 Segmental and somatic dysfunction of cervical region: Secondary | ICD-10-CM | POA: Diagnosis not present

## 2023-03-04 DIAGNOSIS — M9903 Segmental and somatic dysfunction of lumbar region: Secondary | ICD-10-CM | POA: Diagnosis not present

## 2023-03-04 DIAGNOSIS — M5411 Radiculopathy, occipito-atlanto-axial region: Secondary | ICD-10-CM | POA: Diagnosis not present

## 2023-03-04 DIAGNOSIS — M9901 Segmental and somatic dysfunction of cervical region: Secondary | ICD-10-CM | POA: Diagnosis not present

## 2023-03-07 DIAGNOSIS — M5411 Radiculopathy, occipito-atlanto-axial region: Secondary | ICD-10-CM | POA: Diagnosis not present

## 2023-03-07 DIAGNOSIS — M9901 Segmental and somatic dysfunction of cervical region: Secondary | ICD-10-CM | POA: Diagnosis not present

## 2023-03-07 DIAGNOSIS — M9903 Segmental and somatic dysfunction of lumbar region: Secondary | ICD-10-CM | POA: Diagnosis not present

## 2023-03-11 DIAGNOSIS — M5411 Radiculopathy, occipito-atlanto-axial region: Secondary | ICD-10-CM | POA: Diagnosis not present

## 2023-03-11 DIAGNOSIS — M9901 Segmental and somatic dysfunction of cervical region: Secondary | ICD-10-CM | POA: Diagnosis not present

## 2023-03-11 DIAGNOSIS — M9903 Segmental and somatic dysfunction of lumbar region: Secondary | ICD-10-CM | POA: Diagnosis not present
